# Patient Record
Sex: Female | Born: 1995 | Race: White | Hispanic: No | Marital: Single | Smoking: Never smoker
Health system: Southern US, Community
[De-identification: ages and names within clinical notes are randomized; demographics above are authoritative.]

## PROBLEM LIST (undated history)

## (undated) DIAGNOSIS — J45909 Unspecified asthma, uncomplicated: Secondary | ICD-10-CM

## (undated) DIAGNOSIS — G43909 Migraine, unspecified, not intractable, without status migrainosus: Secondary | ICD-10-CM

## (undated) DIAGNOSIS — T7840XA Allergy, unspecified, initial encounter: Secondary | ICD-10-CM

## (undated) HISTORY — DX: Migraine, unspecified, not intractable, without status migrainosus: G43.909

## (undated) HISTORY — PX: HAND SURGERY: SHX662

---
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
## 2017-08-16 ENCOUNTER — Emergency Department (HOSPITAL_BASED_OUTPATIENT_CLINIC_OR_DEPARTMENT_OTHER)
Admission: EM | Admit: 2017-08-16 | Discharge: 2017-08-16 | Disposition: A | Discharge status: Home / Self Care | Payer: BC Managed Care – PPO | Attending: Emergency Medicine | Admitting: Emergency Medicine

## 2017-08-16 ENCOUNTER — Encounter (HOSPITAL_BASED_OUTPATIENT_CLINIC_OR_DEPARTMENT_OTHER): Payer: Self-pay

## 2017-08-16 ENCOUNTER — Emergency Department (HOSPITAL_BASED_OUTPATIENT_CLINIC_OR_DEPARTMENT_OTHER): Payer: BC Managed Care – PPO

## 2017-08-16 ENCOUNTER — Other Ambulatory Visit: Payer: Self-pay

## 2017-08-16 DIAGNOSIS — J4521 Mild intermittent asthma with (acute) exacerbation: Secondary | ICD-10-CM | POA: Diagnosis not present

## 2017-08-16 DIAGNOSIS — R0602 Shortness of breath: Secondary | ICD-10-CM | POA: Diagnosis present

## 2017-08-16 DIAGNOSIS — R05 Cough: Secondary | ICD-10-CM | POA: Diagnosis not present

## 2017-08-16 DIAGNOSIS — B9789 Other viral agents as the cause of diseases classified elsewhere: Secondary | ICD-10-CM

## 2017-08-16 DIAGNOSIS — J069 Acute upper respiratory infection, unspecified: Secondary | ICD-10-CM | POA: Diagnosis not present

## 2017-08-16 DIAGNOSIS — Z79899 Other long term (current) drug therapy: Secondary | ICD-10-CM | POA: Diagnosis not present

## 2017-08-16 LAB — CBC WITH DIFFERENTIAL/PLATELET
BASOS ABS: 0.1 10*3/uL (ref 0.0–0.1)
Basophils Relative: 1 %
Eosinophils Absolute: 1.8 10*3/uL — ABNORMAL HIGH (ref 0.0–0.7)
Eosinophils Relative: 18 %
HEMATOCRIT: 42.3 % (ref 36.0–46.0)
Hemoglobin: 14.3 g/dL (ref 12.0–15.0)
LYMPHS PCT: 17 %
Lymphs Abs: 1.8 10*3/uL (ref 0.7–4.0)
MCH: 30.6 pg (ref 26.0–34.0)
MCHC: 33.8 g/dL (ref 30.0–36.0)
MCV: 90.4 fL (ref 78.0–100.0)
MONO ABS: 1 10*3/uL (ref 0.1–1.0)
Monocytes Relative: 10 %
NEUTROS ABS: 5.6 10*3/uL (ref 1.7–7.7)
Neutrophils Relative %: 54 %
Platelets: 298 10*3/uL (ref 150–400)
RBC: 4.68 MIL/uL (ref 3.87–5.11)
RDW: 12.8 % (ref 11.5–15.5)
WBC: 10.1 10*3/uL (ref 4.0–10.5)

## 2017-08-16 LAB — TROPONIN I: Troponin I: <0.03 ng/mL (ref <0.03)

## 2017-08-16 LAB — COMPREHENSIVE METABOLIC PANEL
ALT: 13 U/L — AB (ref 14–54)
AST: 21 U/L (ref 15–41)
Albumin: 4.3 g/dL (ref 3.5–5.0)
Alkaline Phosphatase: 75 U/L (ref 38–126)
Anion gap: 8 (ref 5–15)
BILIRUBIN TOTAL: 0.8 mg/dL (ref 0.3–1.2)
BUN: 11 mg/dL (ref 6–20)
CALCIUM: 9.3 mg/dL (ref 8.9–10.3)
CO2: 24 mmol/L (ref 22–32)
CREATININE: 0.59 mg/dL (ref 0.44–1.00)
Chloride: 106 mmol/L (ref 101–111)
GFR calc Af Amer: >60 mL/min (ref >60)
Glucose, Bld: 88 mg/dL (ref 65–99)
POTASSIUM: 4 mmol/L (ref 3.5–5.1)
Sodium: 138 mmol/L (ref 135–145)
TOTAL PROTEIN: 7.6 g/dL (ref 6.5–8.1)

## 2017-08-16 MED ORDER — AEROCHAMBER PLUS FLO-VU MEDIUM MISC
1.0000 | Freq: Once | Status: AC
  Administered 2017-08-16: 20:00:00
  Filled 2017-08-16: qty 1

## 2017-08-16 MED ORDER — DEXAMETHASONE 4 MG PO TABS
10.0000 mg | ORAL_TABLET | Freq: Once | ORAL | Status: AC
  Administered 2017-08-16: 10 mg via ORAL
  Filled 2017-08-16: qty 1

## 2017-08-16 MED ORDER — IPRATROPIUM-ALBUTEROL 0.5-2.5 (3) MG/3ML IN SOLN
3.0000 mL | RESPIRATORY_TRACT | Status: DC | PRN
  Administered 2017-08-16: 3 mL via RESPIRATORY_TRACT
  Filled 2017-08-16: qty 3

## 2017-08-16 MED ORDER — ALBUTEROL SULFATE (2.5 MG/3ML) 0.083% IN NEBU
5.0000 mg | INHALATION_SOLUTION | Freq: Once | RESPIRATORY_TRACT | Status: AC
  Administered 2017-08-16: 5 mg via RESPIRATORY_TRACT
  Filled 2017-08-16: qty 6

## 2017-08-16 MED ORDER — ALBUTEROL (5 MG/ML) CONTINUOUS INHALATION SOLN: 20.0000 mg/h | INHALATION_SOLUTION | RESPIRATORY_TRACT | Status: DC

## 2017-08-16 MED ORDER — IPRATROPIUM-ALBUTEROL 0.5-2.5 (3) MG/3ML IN SOLN
3.0000 mL | Freq: Once | RESPIRATORY_TRACT | Status: AC
  Administered 2017-08-16: 3 mL via RESPIRATORY_TRACT
  Filled 2017-08-16: qty 3

## 2017-08-16 MED ORDER — ALBUTEROL SULFATE HFA 108 (90 BASE) MCG/ACT IN AERS
2.0000 | INHALATION_SPRAY | RESPIRATORY_TRACT | Status: DC | PRN
  Administered 2017-08-16: 2 via RESPIRATORY_TRACT
  Filled 2017-08-16: qty 6.7

## 2017-08-16 NOTE — ED Notes (Signed)
Pt was unable to provide a urine specimen at this time. Pt was ambulatory to XR and now is mildly labored.

## 2017-08-16 NOTE — ED Provider Notes (Signed)
[facility] EMERGENCY DEPARTMENT Provider Note   CSN: [id number] Arrival date & time: 08/16/17  1449     History   Chief Complaint Chief Complaint  Patient presents with  . Shortness of Breath    HPI Vanessa Contreras is a 21 y.o. female.  21yo F w/ h/o exercise-induced asthma who p/w shortness of breath and cough.  2 weeks ago the patient began having cold-like symptoms including cough and congestion.  She seemed to get better for a few days and then her symptoms recurred.  She has continued to have cough and congestion and yesterday began feeling short of breath.  She states that her shortness of breath is worse when she is laying flat and she feels like she cannot take a deep breath in because she has chest pain with deep inspiration.  She also reports some sharp chest pain.  Her family has been sick with cold-like symptoms.  She denies any associated fevers, vomiting, diarrhea, or leg swelling/pain.  She has tried albuterol a few times for her symptoms, last dose was this morning, with no relief.  She went to her PCP office today and was sent here due to the nature of her complaints and tachypnea on exam.  No recent travel, history of blood clots, OCP use, or history of cancer. She does report stress at school recently.    The history is provided by the patient.  Shortness of Breath     Past Medical History:  Diagnosis Date  . Allergy   . Asthma    exercise induced    Patient Active Problem List   Diagnosis Date Noted  . Numbness on left side 05/11/2016  . Arm pain, left 05/11/2016  . Hemiplegic migraine without status migrainosus, not intractable 04/05/2016  . Epidermal cyst of neck 03/25/2013    Past Surgical History:  Procedure Laterality Date  . HAND SURGERY     giant cell tumor removed lt hand    OB History    No data available       Home Medications    Prior to Admission medications   Medication Sig Start Date End Date Taking? Authorizing  Provider  albuterol (PROVENTIL HFA;VENTOLIN HFA) 108 (90 BASE) MCG/ACT inhaler Inhale 2 puffs into the lungs every 6 (six) hours as needed for wheezing. Reported on 04/05/2016    [provider]  cetirizine (ZYRTEC) 10 MG tablet Take 10 mg by mouth daily.    [provider]  doxycycline (VIBRAMYCIN) 100 MG capsule  03/07/16   [provider]    Family History Family History  Problem Relation Age of Onset  . Migraines Mother   . Depression Maternal Grandmother   . Anxiety disorder Maternal Grandmother     Social History Social History   Tobacco Use  . Smoking status: Never Smoker  . Smokeless tobacco: Never Used  Substance Use Topics  . Alcohol use: No  . Drug use: No     Allergies   Patient has no known allergies.   Review of Systems Review of Systems  Respiratory: Positive for shortness of breath.    All other systems reviewed and are negative except that which was mentioned in HPI   Physical Exam Updated Vital Signs BP (!) 100/53   Pulse (!) 138   Temp 97.8 F (36.6 C) (Oral)   Resp (!) 24   Ht 5\' 6"  (1.676 m)   Wt 54.4 kg (120 lb)   LMP 08/15/2017   SpO2 97%  BMI 19.37 kg/m   Physical Exam  Constitutional: She is oriented to person, place, and time. She appears well-developed and well-nourished. No distress.  HENT:  Head: Normocephalic and atraumatic.  Mouth/Throat: No oropharyngeal exudate.  Moist mucous membranes  Eyes: Conjunctivae are normal. Pupils are equal, round, and reactive to light.  Neck: Neck supple.  Cardiovascular: Normal rate, regular rhythm and normal heart sounds.  No murmur heard. Pulmonary/Chest: Effort normal. Tachypnea noted. No respiratory distress. She has decreased breath sounds in the right lower field and the left lower field. She has wheezes in the left upper field and the left lower field.  Mildly increased WOB  Abdominal: Soft. Bowel sounds are normal. She exhibits no distension. There is no  tenderness.  Musculoskeletal: She exhibits no edema.  Neurological: She is alert and oriented to person, place, and time.  Fluent speech  Skin: Skin is warm and dry.  Psychiatric: She has a normal mood and affect. Judgment normal.  Nursing note and vitals reviewed.    ED Treatments / Results  Labs (all labs ordered are listed, but only abnormal results are displayed) Labs Reviewed  CBC WITH DIFFERENTIAL/PLATELET - Abnormal; Notable for the following components:      Result Value   Eosinophils Absolute 1.8 (*)    All other components within normal limits  COMPREHENSIVE METABOLIC PANEL - Abnormal; Notable for the following components:   ALT 13 (*)    All other components within normal limits  TROPONIN I  PREGNANCY, URINE    EKG  EKG Interpretation  Date/Time:  Saturday August 16 2017 15:02:59 EST Ventricular Rate:  89 PR Interval:    QRS Duration: 90 QT Interval:  353 QTC Calculation: 430 R Axis:   106 Text Interpretation:  Sinus rhythm Borderline right axis deviation No significant change since last tracing Confirmed by Theotis Burrow [phone]) on 08/16/2017 3:28:38 PM       Radiology Dg Chest 2 View  Result Date: 08/16/2017 CLINICAL DATA:  Shortness of breath with chest pain EXAM: CHEST  2 VIEW COMPARISON:  October 05, 2012 FINDINGS: There is no edema or consolidation. Heart size and pulmonary vascularity are normal. No adenopathy. No pneumothorax. No bone lesions. IMPRESSION: No edema or consolidation. Electronically Signed   By: Lowella Grip III M.D.   On: 08/16/2017 16:04    Procedures .Critical Care Performed by: Sharlett Iles, MD Authorized by: Sharlett Iles, MD   Critical care provider statement:    Critical care time (minutes):  30   Critical care time was exclusive of:  Separately billable procedures and treating other patients   Critical care was necessary to treat or prevent imminent or life-threatening deterioration of the  following conditions:  Respiratory failure   Critical care was time spent personally by me on the following activities:  Development of treatment plan with patient or surrogate, evaluation of patient's response to treatment, examination of patient, obtaining history from patient or surrogate, ordering and performing treatments and interventions, ordering and review of radiographic studies, re-evaluation of patient's condition and ordering and review of laboratory studies   (including critical care time)  Medications Ordered in ED Medications  ipratropium-albuterol (DUONEB) 0.5-2.5 (3) MG/3ML nebulizer solution 3 mL (3 mLs Nebulization Given 08/16/17 1605)  albuterol (PROVENTIL HFA;VENTOLIN HFA) 108 (90 Base) MCG/ACT inhaler 2 puff (not administered)  AEROCHAMBER PLUS FLO-VU MEDIUM MISC 1 each (not administered)  ipratropium-albuterol (DUONEB) 0.5-2.5 (3) MG/3ML nebulizer solution 3 mL (3 mLs Nebulization Given 08/16/17 1525)  dexamethasone (DECADRON)  tablet 10 mg (10 mg Oral Given 08/16/17 1558)  albuterol (PROVENTIL) (2.5 MG/3ML) 0.083% nebulizer solution 5 mg (5 mg Nebulization Given 08/16/17 1702)  albuterol (PROVENTIL) (2.5 MG/3ML) 0.083% nebulizer solution 5 mg (5 mg Nebulization Given 08/16/17 1701)  albuterol (PROVENTIL) (2.5 MG/3ML) 0.083% nebulizer solution 5 mg (5 mg Nebulization Given 08/16/17 1701)  albuterol (PROVENTIL) (2.5 MG/3ML) 0.083% nebulizer solution 5 mg (5 mg Nebulization Given 08/16/17 1701)     Initial Impression / Assessment and Plan / ED Course  I have reviewed the triage vital signs and the nursing notes.  Pertinent labs & imaging results that were available during my care of the patient were reviewed by me and considered in my medical decision making (see chart for details).     URI sx, now developed SOB and chest pain. Cough has continued. Non-toxic on exam with reassuring VS. Wheezes in L lung, diminished b/l. Gave duoneb x 2 with only mild improvement,  however persistently diminished thus placed on 1hr continuous albuterol.  Observed for an hour after continuous.  On reexamination, she was comfortable stating that her chest pain had completely resolved and her shortness of breath was much better.  Mom states that she looked a lot better and patient was able to ambulate without problems, O2 saturation 100% after ambulation.  Her chest x-ray shows no infiltrate, labs including WBC count are unremarkable.  I considered PE but patient has no risk factors for PE and has demonstrated much improvement after albuterol.  Although she states she has only intermittent exercise-induced asthma, I suspect asthma exacerbation from viral URI.  She will follow-up with her PCP this week and understands supportive care instructions including scheduled albuterol and cough suppressant medications.  Return precautions extensively reviewed with patient and her mom.  They voiced understanding and she was discharged in satisfactory condition.  Final Clinical Impressions(s) / ED Diagnoses   Final diagnoses:  Mild intermittent asthma with exacerbation  Viral URI with cough    ED Discharge Orders    None       Rashee Marschall, Wenda Overland, MD 08/16/17 1946

## 2017-08-16 NOTE — ED Notes (Signed)
Pt noted to have some tremors in the hands. Pt unsure if she is cold or nervous. 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

## 2017-08-16 NOTE — ED Triage Notes (Signed)
SOB and palpitations x 2 weeks, worsening last night. Sent from [facility] for further eval.

## 2017-09-17 ENCOUNTER — Other Ambulatory Visit: Payer: BC Managed Care – PPO

## 2017-09-17 ENCOUNTER — Encounter: Payer: Self-pay | Admitting: Pulmonary Disease

## 2017-09-17 ENCOUNTER — Ambulatory Visit: Payer: BC Managed Care – PPO | Admitting: Pulmonary Disease

## 2017-09-17 VITALS — BP 120/70 | HR 71 | Ht 66.0 in | Wt 125.0 lb

## 2017-09-17 DIAGNOSIS — R05 Cough: Secondary | ICD-10-CM

## 2017-09-17 DIAGNOSIS — R059 Cough, unspecified: Secondary | ICD-10-CM

## 2017-09-17 LAB — NITRIC OXIDE: Nitric Oxide: 138

## 2017-09-17 MED ORDER — FLUTICASONE FUROATE-VILANTEROL 200-25 MCG/INH IN AEPB: 1.0000 | INHALATION_SPRAY | Freq: Every day | RESPIRATORY_TRACT | 6 refills | Status: DC

## 2017-09-17 MED ORDER — FLUTICASONE FUROATE-VILANTEROL 200-25 MCG/INH IN AEPB: 1.0000 | INHALATION_SPRAY | Freq: Every day | RESPIRATORY_TRACT | 0 refills | Status: AC

## 2017-09-17 NOTE — Progress Notes (Addendum)
Vanessa Contreras    [id number]    03-06-1996  Primary Care Physician:Webb, Arbie Cookey, MD  Referring Physician: Maurice Small, MD [address] [place], [place] [address]  Chief complaint: Consult for evaluation of asthma  HPI: 21 year old with history of allergies, asthma.  She has a history of childhood asthma with daily symptoms of cough, shortness of breath, wheezing.  Seen in the ED in October 2018 after a viral tract infection with acute worsening of dyspnea.  She was given nebulizers with improvement in symptoms. She is on albuterol inhaler which she uses several times a day.  Denies nocturnal awakenings. She has multiple environmental allergies and sees Dr. Heber [place], allergist at [facility] and her skin testing shows sensitivity to pollen, grass, cats, dogs and multiple other irritants.  She has started skin shots couple of weeks ago.  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 Exposures: Exposure to animals as noted above.  No mold exposure. Smoking history: Never smoker. No secondhand smoke. Travel History: Not significant  Outpatient Encounter Medications as of 09/17/2017  Medication Sig  . albuterol (PROVENTIL HFA;VENTOLIN HFA) 108 (90 BASE) MCG/ACT inhaler Inhale 2 puffs into the lungs every 6 (six) hours as needed for wheezing. Reported on 04/05/2016  . cetirizine (ZYRTEC) 10 MG tablet Take 10 mg by mouth daily.  Marland Kitchen doxycycline (VIBRAMYCIN) 100 MG capsule    No facility-administered encounter medications on file as of 09/17/2017.     Allergies as of 09/17/2017  . (No Known Allergies)    Past Medical History:  Diagnosis Date  . Allergy   . Asthma    exercise induced    Past Surgical History:  Procedure Laterality Date  . EAR CYST EXCISION Left 03/25/2013   Procedure: EXCISION OF A LEFT POSTERIOR AURICULAR CYST;   Surgeon: Theodoro Kos, DO;  Location: [facility];  Service: Plastics;  Laterality: Left;  . HAND SURGERY     giant cell tumor removed lt hand    Family History  Problem Relation Age of Onset  . Migraines Mother   . Depression Maternal Grandmother   . Anxiety disorder Maternal Grandmother     Social History   Socioeconomic History  . Marital status: Single    Spouse name: Not on file  . Number of children: Not on file  . Years of education: Not on file  . Highest education level: Not on file  Social Needs  . Financial resource strain: Not on file  . Food insecurity - worry: Not on file  . Food insecurity - inability: Not on file  . Transportation needs - medical: Not on file  . Transportation needs - non-medical: Not on file  Occupational History  . Not on file  Tobacco Use  . Smoking status: Never Smoker  . Smokeless tobacco: Never Used  Substance and Sexual Activity  . Alcohol use: No  . Drug use: No  . Sexual activity: No  Other Topics Concern  . 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 She does well in school. She lives with her parents and siblings. 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     Review of systems: Review of Systems  Constitutional: Negative for fever and chills.  HENT: Negative.   Eyes: Negative  for blurred vision.  Respiratory: as per HPI  Cardiovascular: Negative for chest pain and palpitations.  Gastrointestinal: Negative for vomiting, diarrhea, blood per rectum. Genitourinary: Negative for dysuria, urgency, frequency and hematuria.  Musculoskeletal: Negative for myalgias, back pain and joint pain.  Skin: Negative for itching and rash.  Neurological: Negative for dizziness, tremors, focal weakness, seizures and loss of consciousness.  Endo/Heme/Allergies: Negative for environmental allergies.  Psychiatric/Behavioral: Negative for depression, suicidal ideas and hallucinations.  All  other systems reviewed and are negative.  Physical Exam: Blood pressure 120/70, pulse 71, height 5\' 6"  (1.676 m), weight 125 lb (56.7 kg), SpO2 98 %. Gen:      No acute distress HEENT:  EOMI, sclera anicteric Neck:     No masses; no thyromegaly Lungs:    Clear to auscultation bilaterally; normal respiratory effort CV:         Regular rate and rhythm; no murmurs Abd:      + bowel sounds; soft, non-tender; no palpable masses, no distension Ext:    No edema; adequate peripheral perfusion Skin:      Warm and dry; no rash Neuro: alert and oriented x 3 Psych: normal mood and affect  Data Reviewed: FENO 09/17/17-138  CBC with diff 08/16/17- WBC 10.1, eosinophils 18%, absolute eosinophil count 1800.  CXR 08/16/17- No acute abnormality.  Assessment:  Evaluation for asthma Cloey has classic atopic allergic asthma and her symptoms and lung inflammation are not under good control.  Her acute worsening may have been set off by viral respiratory tract infection but as per mom she has been symptomatic even before this.  She has allergies to multiple animals but keeps a lot of rescue pets at home and wants to attend veterenary school.  She just uses the albuterol rescue inhaler but will need to be on a controller medication.  I will start her on Brio 200 Check blood allergy profile and obtain records from her allergist at [facility]. Schedule for pulmonary function test.  Plan/Recommendations: - Blood allergy profile, PFTs - Start Breo 200, continue albuterol  Marshell Garfinkel MD [facility] Pager [phone] 09/17/2017, 9:50 AM  CC: Maurice Small, MD  Addendum: Received note from [facility] [place].  Office visit 09/23/17 Skin testing performed showing significant reactivity to tree pollen, grass pollen, weed pollen, dust mite, dog, cat and molds.  Negative to cockroach.  Plan-begin Rhinocort, Pazeo eyedrops.  Continue Zyrtec Start  immunotherapy.  Marshell Garfinkel MD [facility] Pager [phone] 10/22/2017, 9:00 AM

## 2017-09-17 NOTE — Addendum Note (Signed)
Addended by: Maryanna Shape A on: 09/17/2017 10:38 AM   Modules accepted: Orders

## 2017-09-17 NOTE — Patient Instructions (Signed)
We will check a blood allergy profile today Schedule you for pulmonary function test Continue the albuterol inhaler.  You will need to use this as a rescue inhaler to be used as needed We will start you on a regular controller inhaler to reduce levels of inflammation in your lungs. The controller medication's name is breo.  You need to use it once a day.  We will give instructions on how to use it We will obtain notes from your allergy doctor for our record I will you her back in 1 month.

## 2017-09-18 LAB — RESPIRATORY ALLERGY PROFILE REGION II ~~LOC~~
ALLERGEN, A. ALTERNATA, M6: 8.91 kU/L — AB
ALLERGEN, COTTONWOOD, T14: 0.29 kU/L — AB
ALLERGEN, D PTERNOYSSINUS, D1: 0.54 kU/L — AB
ALLERGEN, MOUSE U PROTEIN, E72: 6.31 kU/L — AB
ALLERGEN, OAK, T7: 0.11 kU/L — AB
ASPERGILLUS FUMIGATUS M3: 1.13 kU/L — AB
Allergen, Cedar tree, t12: 0.22 kU/L — ABNORMAL HIGH
Allergen, Comm Silver Birch, t9: <0.1 kU/L
Allergen, Mulberry, t76: <0.1 kU/L
Allergen, P. notatum, m1: 1.42 kU/L — ABNORMAL HIGH
BERMUDA GRASS: 0.13 kU/L — AB
BOX ELDER: 0.25 kU/L — AB
CLADOSPORIUM HERBARUM (M2) IGE: 1.19 kU/L — ABNORMAL HIGH
CLASS: 0
CLASS: 0
CLASS: 0
CLASS: 0
CLASS: 0
CLASS: 0
CLASS: 1
CLASS: 2
CLASS: 2
COMMON RAGWEED (SHORT) (W1) IGE: 0.13 kU/L — ABNORMAL HIGH
Cat Dander: 1.09 kU/L — ABNORMAL HIGH
Class: 0
Class: 0
Class: 0
Class: 0
Class: 0
Class: 0
Class: 0
Class: 0
Class: 0
Class: 2
Class: 2
Class: 2
Class: 2
Class: 3
Class: 3
Cockroach: <0.1 kU/L
D. FARINAE: 0.75 kU/L — AB
Dog Dander: 1.76 kU/L — ABNORMAL HIGH
ELM IGE: 0.15 kU/L — AB
IgE (Immunoglobulin E), Serum: 1301 kU/L — ABNORMAL HIGH (ref <=114)
JOHNSON GRASS: 0.16 kU/L — AB
Pecan/Hickory Tree IgE: 0.25 kU/L — ABNORMAL HIGH
ROUGH PIGWEED IGE: 0.19 kU/L — AB
SHEEP SORREL IGE: 0.11 kU/L — AB
TIMOTHY GRASS: 0.26 kU/L — AB

## 2017-09-18 LAB — INTERPRETATION:

## 2017-10-20 ENCOUNTER — Telehealth: Payer: Self-pay | Admitting: Pulmonary Disease

## 2017-10-20 NOTE — Telephone Encounter (Signed)
Spoke with pt's dad and he states he just received a bill for the Feno. This is not covered by the insurance. Kathlee Nations can we take this off his bill? Thanks    [employer] states:  Request for nitric oxide is denied, per corporate medical policy this service is considered investigational. [facility] found insufficient peer review literature to show a beneficial effect on health outcomes compared to established alternatives. The member policy does not cover investigational services.

## 2017-10-20 NOTE — Telephone Encounter (Signed)
[place] noted. Thanks Kathlee Nations

## 2017-10-20 NOTE — Telephone Encounter (Signed)
The Feno was written off, he will not receive a bill from [facility] for this.  Thanks,  Kathlee Nations

## 2017-10-24 ENCOUNTER — Ambulatory Visit: Payer: BC Managed Care – PPO | Admitting: Pulmonary Disease

## 2017-11-07 ENCOUNTER — Ambulatory Visit: Payer: BC Managed Care – PPO | Admitting: Pulmonary Disease

## 2017-11-07 ENCOUNTER — Ambulatory Visit (INDEPENDENT_AMBULATORY_CARE_PROVIDER_SITE_OTHER): Payer: BC Managed Care – PPO | Admitting: Pulmonary Disease

## 2017-11-07 ENCOUNTER — Encounter: Payer: Self-pay | Admitting: Pulmonary Disease

## 2017-11-07 VITALS — BP 116/72 | HR 75 | Ht 66.0 in | Wt 125.0 lb

## 2017-11-07 DIAGNOSIS — R0602 Shortness of breath: Secondary | ICD-10-CM

## 2017-11-07 DIAGNOSIS — R05 Cough: Secondary | ICD-10-CM | POA: Diagnosis not present

## 2017-11-07 DIAGNOSIS — J4540 Moderate persistent asthma, uncomplicated: Secondary | ICD-10-CM

## 2017-11-07 DIAGNOSIS — R059 Cough, unspecified: Secondary | ICD-10-CM

## 2017-11-07 LAB — PULMONARY FUNCTION TEST
DL/VA % PRED: 108 %
DL/VA: 5.49 ml/min/mmHg/L
DLCO unc % pred: 100 %
DLCO unc: 27.19 ml/min/mmHg
FEF 25-75 POST: 3.66 L/s
FEF 25-75 Pre: 5.2 L/sec
FEF2575-%CHANGE-POST: -29 %
FEF2575-%PRED-PRE: 133 %
FEF2575-%Pred-Post: 94 %
FEV1-%Change-Post: -4 %
FEV1-%Pred-Post: 94 %
FEV1-%Pred-Pre: 99 %
FEV1-PRE: 3.49 L
FEV1-Post: 3.32 L
FEV1FVC-%CHANGE-POST: 0 %
FEV1FVC-%Pred-Pre: 105 %
FEV6-%CHANGE-POST: -5 %
FEV6-%Pred-Post: 89 %
FEV6-%Pred-Pre: 94 %
FEV6-PRE: 3.81 L
FEV6-Post: 3.6 L
FEV6FVC-%Pred-Post: 100 %
FEV6FVC-%Pred-Pre: 100 %
FVC-%CHANGE-POST: -5 %
FVC-%PRED-POST: 89 %
FVC-%Pred-Pre: 94 %
FVC-Post: 3.6 L
FVC-Pre: 3.81 L
POST FEV1/FVC RATIO: 92 %
Post FEV6/FVC ratio: 100 %
Pre FEV1/FVC ratio: 92 %
Pre FEV6/FVC Ratio: 100 %
RV % pred: 125 %
RV: 1.59 L
TLC % pred: 93 %
TLC: 5.03 L

## 2017-11-07 LAB — NITRIC OXIDE: Nitric Oxide: 24

## 2017-11-07 NOTE — Patient Instructions (Addendum)
I am glad that your breathing is improved on the inhaler Continue Breo for now.  Will recheck FENO Your lung function test look normal which is good news. Follow-up in 3 months.

## 2017-11-07 NOTE — Progress Notes (Signed)
PFT completed today 11/07/17

## 2017-11-07 NOTE — Progress Notes (Signed)
Vanessa Contreras    [id number]    1995/11/15  Primary Care Physician:Webb, Arbie Cookey, MD  Referring Physician: Maurice Small, MD [address] [place], [place] [address]  Chief complaint: Follow-up for moderate persistent asthma  HPI: 22 year old with history of allergies, asthma.  She has a history of childhood asthma with daily symptoms of cough, shortness of breath, wheezing.  Seen in the ED in October 2018 after a viral tract infection with acute worsening of dyspnea.  She was given nebulizers with improvement in symptoms. She is on albuterol inhaler which she uses several times a day.  Denies nocturnal awakenings. She has multiple environmental allergies and sees Dr. Heber [place], allergist at [facility] and her skin testing shows sensitivity to pollen, grass, cats, dogs and multiple other irritants.  She has started skin shots couple of weeks ago.  Received note from [facility] of [place].  Office visit 05/11/15 Skin testing performed showing significant reactivity to tree pollen, grass pollen, weed pollen, dust mite, dog, cat and molds.  Negative to cockroach.  Plan-begin Rhinocort, Pazeo eyedrops.  Continue Zyrtec Start immunotherapy.  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 Exposures: Exposure to animals as noted above.  No mold exposure. Smoking history: Never smoker. No secondhand smoke. Travel History: Not significant  Interim history: Started on breo at last visit.  She reports significant improvement in her symptoms of dyspnea.  She is not using her rescue inhaler anymore and continues on allergy shots.  Outpatient Encounter Medications as of 11/07/2017  Medication Sig  . albuterol (PROVENTIL HFA;VENTOLIN HFA) 108 (90 BASE) MCG/ACT inhaler Inhale 2 puffs into the lungs every 6 (six) hours as needed for wheezing. Reported on  04/05/2016  . cetirizine (ZYRTEC) 10 MG tablet Take 10 mg by mouth daily.  Marland Kitchen doxycycline (VIBRAMYCIN) 100 MG capsule   . fluticasone furoate-vilanterol (BREO ELLIPTA) 200-25 MCG/INH AEPB Inhale 1 puff into the lungs daily.   No facility-administered encounter medications on file as of 11/07/2017.     Allergies as of 11/07/2017  . (No Known Allergies)    Past Medical History:  Diagnosis Date  . Allergy   . Asthma    exercise induced    Past Surgical History:  Procedure Laterality Date  . EAR CYST EXCISION Left 03/25/2013   Procedure: EXCISION OF A LEFT POSTERIOR AURICULAR CYST;  Surgeon: Theodoro Kos, DO;  Location: [facility];  Service: Plastics;  Laterality: Left;  . HAND SURGERY     giant cell tumor removed lt hand    Family History  Problem Relation Age of Onset  . Migraines Mother   . Depression Maternal Grandmother   . Anxiety disorder Maternal Grandmother     Social History   Socioeconomic History  . Marital status: Single    Spouse name: Not on file  . Number of children: Not on file  . Years of education: Not on file  . Highest education level: Not on file  Social Needs  . Financial resource strain: Not on file  . Food insecurity - worry: Not on file  . Food insecurity - inability: Not on file  . Transportation needs - medical: Not on file  . Transportation needs - non-medical: Not on file  Occupational History  . Not on file  Tobacco Use  . Smoking status: Never Smoker  .  Smokeless tobacco: Never Used  Substance and Sexual Activity  . Alcohol use: No  . Drug use: No  . Sexual activity: No  Other Topics Concern  . 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 She lives with her parents and siblings. 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     Review of systems: Review of Systems  Constitutional: Negative for fever and chills.  HENT: Negative.   Eyes: Negative for  blurred vision.  Respiratory: as per HPI  Cardiovascular: Negative for chest pain and palpitations.  Gastrointestinal: Negative for vomiting, diarrhea, blood per rectum. Genitourinary: Negative for dysuria, urgency, frequency and hematuria.  Musculoskeletal: Negative for myalgias, back pain and joint pain.  Skin: Negative for itching and rash.  Neurological: Negative for dizziness, tremors, focal weakness, seizures and loss of consciousness.  Endo/Heme/Allergies: Negative for environmental allergies.  Psychiatric/Behavioral: Negative for depression, suicidal ideas and hallucinations.  All other systems reviewed and are negative.  Physical Exam: Blood pressure 116/72, pulse 75, height 5\' 6"  (1.676 m), weight 125 lb (56.7 kg), SpO2 97 %. Gen:      No acute distress HEENT:  EOMI, sclera anicteric Neck:     No masses; no thyromegaly Lungs:    Clear to auscultation bilaterally; normal respiratory effort CV:         Regular rate and rhythm; no murmurs Abd:      + bowel sounds; soft, non-tender; no palpable masses, no distension Ext:    No edema; adequate peripheral perfusion Skin:      Warm and dry; no rash Neuro: alert and oriented x 3 Psych: normal mood and affect  Data Reviewed: FENO 09/17/17-138 FENO 11/07/16-24  CBC with diff 08/16/17- WBC 10.1, eosinophils 18%, absolute eosinophil count 1800. CXR 08/16/17- No acute abnormality.  Blood allergy profile 09/17/17 IgE 1301, RAST panel shows sensitivity to multiple allergens including cat, dog, dust mite, grass, pollen  PFTs 11/07/17 FVC 3.6 [9%], FEV1 3.32 [94%], F/F 92, TLC 93%, DLCO 100% Normal test  Assessment:  Moderate persistent asthma Vanessa Contreras has classic atopic allergic asthma. She has allergies to multiple animals but keeps a lot of rescue pets at home and wants to attend veterenary school. I cautioned her that she may need to be on some form of long-term controller medication if she continues to have environmental exposure to  animals. She is considering a switch in careers to biochemistry  Symptoms have improved on Breo inhaler with reduction and FENO She will continue on this for now and albuterol rescue inhaler. Reassess at next visit.  We may be able to start titrating the inhaler therapy  Plan/Recommendations: - Continue Breo 200, albuterol   Follow up in 3 months  Marshell Garfinkel MD [facility] Pager [phone] 11/07/2017, 4:48 PM  CC: Maurice Small, MD  Addendum:   Marshell Garfinkel MD [facility] Pager [phone] 11/07/2017, 4:48 PM

## 2018-02-16 ENCOUNTER — Ambulatory Visit (INDEPENDENT_AMBULATORY_CARE_PROVIDER_SITE_OTHER): Payer: BC Managed Care – PPO

## 2018-02-16 ENCOUNTER — Ambulatory Visit (INDEPENDENT_AMBULATORY_CARE_PROVIDER_SITE_OTHER): Payer: BC Managed Care – PPO | Admitting: Podiatry

## 2018-02-16 ENCOUNTER — Encounter: Payer: Self-pay | Admitting: Podiatry

## 2018-02-16 DIAGNOSIS — M779 Enthesopathy, unspecified: Secondary | ICD-10-CM | POA: Diagnosis not present

## 2018-02-16 DIAGNOSIS — M2012 Hallux valgus (acquired), left foot: Secondary | ICD-10-CM

## 2018-02-16 DIAGNOSIS — M25571 Pain in right ankle and joints of right foot: Secondary | ICD-10-CM

## 2018-02-16 DIAGNOSIS — M7750 Other enthesopathy of unspecified foot: Secondary | ICD-10-CM

## 2018-02-16 DIAGNOSIS — G8929 Other chronic pain: Secondary | ICD-10-CM

## 2018-02-16 NOTE — Progress Notes (Signed)
Subjective:    Patient ID: Vanessa Contreras, female    DOB: April 25, 1996, 22 y.o.   MRN: [id number]  HPI 22 year old female presenting for acute concerns.  Her primary concern is she is starting to notice some pain to her left big toe with shoes and pressure.  She said no recent treatment for this.  She also states that for several years her right ankle has been popping and will get stiff.  She is previously had x-rays which were negative she had no treatment for this.  She denies any recent injury or trauma to her feet in general.  No swelling.  No other concerns today.   Review of Systems  All other systems reviewed and are negative.  Past Medical History:  Diagnosis Date  . Allergy   . Asthma    exercise induced    Past Surgical History:  Procedure Laterality Date  . EAR CYST EXCISION Left 03/25/2013   Procedure: EXCISION OF A LEFT POSTERIOR AURICULAR CYST;  Surgeon: Theodoro Kos, DO;  Location: [place];  Service: Plastics;  Laterality: Left;  . HAND SURGERY     giant cell tumor removed lt hand     Current Outpatient Medications:  .  albuterol (PROVENTIL HFA;VENTOLIN HFA) 108 (90 BASE) MCG/ACT inhaler, Inhale 2 puffs into the lungs every 6 (six) hours as needed for wheezing. Reported on 04/05/2016, Disp: , Rfl:  .  cetirizine (ZYRTEC) 10 MG tablet, Take 10 mg by mouth daily., Disp: , Rfl:  .  doxycycline (VIBRAMYCIN) 100 MG capsule, , Disp: , Rfl:  .  fluticasone furoate-vilanterol (BREO ELLIPTA) 200-25 MCG/INH AEPB, Inhale 1 puff into the lungs daily., Disp: 60 each, Rfl: 6  No Known Allergies  Social History   Socioeconomic History  . Marital status: Single    Spouse name: Not on file  . Number of children: Not on file  . Years of education: Not on file  . Highest education level: Not on file  Occupational History  . Not on file  Social Needs  . Financial resource strain: Not on file  . Food insecurity:    Worry: Not on file    Inability: Not on file   . Transportation needs:    Medical: Not on file    Non-medical: Not on file  Tobacco Use  . Smoking status: Never Smoker  . Smokeless tobacco: Never Used  Substance and Sexual Activity  . Alcohol use: No  . Drug use: No  . Sexual activity: Never  Lifestyle  . Physical activity:    Days per week: Not on file    Minutes per session: Not on file  . Stress: Not on file  Relationships  . Social connections:    Talks on phone: Not on file    Gets together: Not on file    Attends religious service: Not on file    Active member of club or organization: Not on file    Attends meetings of clubs or organizations: Not on file    Relationship status: Not on file  . Intimate partner violence:    Fear of current or ex partner: Not on file    Emotionally abused: Not on file    Physically abused: Not on file    Forced sexual activity: Not on file  Other Topics Concern  . 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 She lives with her  parents and siblings. 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         Objective:   Physical Exam  General: AAO x3, NAD  Dermatological: Skin is warm, dry and supple bilateral. Nails x 10 are well manicured; remaining integument appears unremarkable at this time. There are no open sores, no preulcerative lesions, no rash or signs of infection present.  Vascular: Dorsalis Pedis artery and Posterior Tibial artery pedal pulses are 2/4 bilateral with immedate capillary fill time. Pedal hair growth present. No varicosities and no lower extremity edema present bilateral. There is no pain with calf compression, swelling, warmth, erythema.   Neruologic: Grossly intact via light touch bilateral. Vibratory intact via tuning fork bilateral. Protective threshold with Semmes Wienstein monofilament intact to all pedal sites bilateral.   Musculoskeletal: Cavus foot type is present.  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  There is no pain or crepitation with MPJ range of motion there is no first ray hypomobility present.  On the right side there is no significant discomfort to the ankle joint there is no pain or restriction with ankle joint range of motion there is no crepitation.  No other areas of tenderness identified at this time.  Muscular strength 5/5 in all groups tested bilateral.  Gait: Unassisted, Nonantalgic.     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  There is no evidence of acute fracture or stress fracture bilaterally.  Ankle joint space maintained on the right side. At this time we discussed both conservative as well as surgical treatment options.  She does have a history of wearing orthotics.  Given her cavus foot type this may be beneficial for her.  I think that some of the ankle pain that she is getting is due to biomechanical changes.  Also the hopefully the orthotics will help slow the progression of the bunion take pressure off the bunion as well.  Ultimately she will likely need to have surgical intervention if symptoms continue but we also discussed the change in shoe type as well as offloading of various pads.  She did not proceed with orthotics today.  Rick evaluated her and ordered her for the inserts.  Trula Slade DPM

## 2018-02-17 ENCOUNTER — Encounter: Payer: Self-pay | Admitting: Pulmonary Disease

## 2018-02-17 ENCOUNTER — Ambulatory Visit: Payer: BC Managed Care – PPO | Admitting: Pulmonary Disease

## 2018-02-17 VITALS — BP 90/60 | HR 65 | Ht 66.0 in | Wt 123.4 lb

## 2018-02-17 DIAGNOSIS — J4540 Moderate persistent asthma, uncomplicated: Secondary | ICD-10-CM | POA: Diagnosis not present

## 2018-02-17 MED ORDER — FLUTICASONE FUROATE-VILANTEROL 100-25 MCG/INH IN AEPB: 1.0000 | INHALATION_SPRAY | Freq: Every day | RESPIRATORY_TRACT | 12 refills | Status: DC

## 2018-02-17 NOTE — Progress Notes (Addendum)
Vanessa Contreras    [id number]    06/12/1996  Primary Care Physician:Webb, Arbie Cookey, MD  Referring Physician: Maurice Small, MD [address] [place], [place] [address]  Chief complaint: Follow-up for moderate persistent asthma  HPI: 22 year old with history of allergies, asthma.  She has a history of childhood asthma with daily symptoms of cough, shortness of breath, wheezing.  Seen in the ED in October 2018 after a viral tract infection with acute worsening of dyspnea.  She was given nebulizers with improvement in symptoms. She is on albuterol inhaler which she uses several times a day.  Denies nocturnal awakenings. She has multiple environmental allergies and sees Dr. Heber [place], allergist at [facility] and her skin testing shows sensitivity to pollen, grass, cats, dogs and multiple other irritants.  She has started skin shots couple of weeks ago.  Received note from [facility] [place].  Office visit 05/11/15 Skin testing performed showing significant reactivity to tree pollen, grass pollen, weed pollen, dust mite, dog, cat and molds.  Negative to cockroach.  Plan-begin Rhinocort, Pazeo eyedrops.  Continue Zyrtec Start immunotherapy.  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 Exposures: Exposure to animals as noted above.  No mold exposure. Smoking history: Never smoker. No secondhand smoke. Travel History: Not significant  Interim history: Continues on the Breo with good response.  She had a cold the past month but this has not affected her breathing She is not using her rescue inhaler due to control of symptoms She continues on the allergy shots.  Outpatient Encounter Medications as of 02/17/2018  Medication Sig  . albuterol (PROVENTIL HFA;VENTOLIN HFA) 108 (90 BASE) MCG/ACT inhaler Inhale 2 puffs into the lungs every 6 (six)  hours as needed for wheezing. Reported on 04/05/2016  . cetirizine (ZYRTEC) 10 MG tablet Take 10 mg by mouth daily.  . fluticasone furoate-vilanterol (BREO ELLIPTA) 200-25 MCG/INH AEPB Inhale 1 puff into the lungs daily.  . [DISCONTINUED] doxycycline (VIBRAMYCIN) 100 MG capsule    No facility-administered encounter medications on file as of 02/17/2018.     Allergies as of 02/17/2018  . (No Known Allergies)    Past Medical History:  Diagnosis Date  . Allergy   . Asthma    exercise induced    Past Surgical History:  Procedure Laterality Date  . EAR CYST EXCISION Left 03/25/2013   Procedure: EXCISION OF A LEFT POSTERIOR AURICULAR CYST;  Surgeon: Theodoro Kos, DO;  Location: [facility];  Service: Plastics;  Laterality: Left;  . HAND SURGERY     giant cell tumor removed lt hand    Family History  Problem Relation Age of Onset  . Migraines Mother   . Depression Maternal Grandmother   . Anxiety disorder Maternal Grandmother     Social History   Socioeconomic History  . Marital status: Single    Spouse name: Not on file  . Number of children: Not on file  . Years of education: Not on file  . Highest education level: Not on file  Occupational History  . Not on file  Social Needs  . Financial resource strain: Not on file  . Food insecurity:    Worry: Not on file    Inability: Not on file  . Transportation needs:    Medical: Not on file    Non-medical: Not  on file  Tobacco Use  . Smoking status: Never Smoker  . Smokeless tobacco: Never Used  Substance and Sexual Activity  . Alcohol use: No  . Drug use: No  . Sexual activity: Never  Lifestyle  . Physical activity:    Days per week: Not on file    Minutes per session: Not on file  . Stress: Not on file  Relationships  . Social connections:    Talks on phone: Not on file    Gets together: Not on file    Attends religious service: Not on file    Active member of club or organization: Not on file     Attends meetings of clubs or organizations: Not on file    Relationship status: Not on file  . Intimate partner violence:    Fear of current or ex partner: Not on file    Emotionally abused: Not on file    Physically abused: Not on file    Forced sexual activity: Not on file  Other Topics Concern  . 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 She lives with her parents and siblings. 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     Review of systems: Review of Systems  Constitutional: Negative for fever and chills.  HENT: Negative.   Eyes: Negative for blurred vision.  Respiratory: as per HPI  Cardiovascular: Negative for chest pain and palpitations.  Gastrointestinal: Negative for vomiting, diarrhea, blood per rectum. Genitourinary: Negative for dysuria, urgency, frequency and hematuria.  Musculoskeletal: Negative for myalgias, back pain and joint pain.  Skin: Negative for itching and rash.  Neurological: Negative for dizziness, tremors, focal weakness, seizures and loss of consciousness.  Endo/Heme/Allergies: Negative for environmental allergies.  Psychiatric/Behavioral: Negative for depression, suicidal ideas and hallucinations.  All other systems reviewed and are negative.  Physical Exam: Blood pressure 90/60, pulse 65, height 5\' 6"  (1.676 m), weight 123 lb 6.4 oz (56 kg), SpO2 100 %. Gen:      No acute distress HEENT:  EOMI, sclera anicteric Neck:     No masses; no thyromegaly Lungs:    Clear to auscultation bilaterally; normal respiratory effort CV:         Regular rate and rhythm; no murmurs Abd:      + bowel sounds; soft, non-tender; no palpable masses, no distension Ext:    No edema; adequate peripheral perfusion Skin:      Warm and dry; no rash Neuro: alert and oriented x 3 Psych: normal mood and affect  Data Reviewed: FENO 09/17/17-138 FENO 11/07/16-24  CBC with diff 08/16/17- WBC 10.1, eosinophils 18%,  absolute eosinophil count 1800. CXR 08/16/17- No acute abnormality.  Blood allergy profile 09/17/17 IgE 1301, RAST panel shows sensitivity to multiple allergens including cat, dog, dust mite, grass, pollen  PFTs 11/07/17 FVC 3.6 [9%], FEV1 3.32 [94%], F/F 92, TLC 93%, DLCO 100% Normal test  Assessment:  Moderate persistent asthma Aubriana has classic atopic allergic asthma. She has allergies to multiple animals but keeps a lot of rescue pets at home and wants to attend veterenary school. I cautioned her that she may need to be on some form of long-term controller medication if she continues to have environmental exposure to animals.   Symptoms have improved with Breo inhaler with reduction and FENO We will reduce the Breo to 100  Plan/Recommendations: - Reduce Breo to 100, continue albuterol as needed  Follow up in 6 months  Marshell Garfinkel MD [facility] 02/17/2018, 4:25 PM  CC: Maurice Small, MD   Addendum: Received note from [facility] [place] dated April 16, 2018 She continues on immunotherapy with overall good response Slight worsening of her breathing when Breo was reduced to 100 mcg.  Using albuterol 3 times a week.  Need to review symptom control return visit  Marshell Garfinkel MD [facility] 05/20/2018, 12:27 PM

## 2018-02-17 NOTE — Patient Instructions (Signed)
I am glad you are doing well with the breathing We will reduce the Breo to 100 Follow-up in November 2019.  Please call us sooner if there is any worsening of your symptoms with the change in medication.

## 2018-03-09 ENCOUNTER — Ambulatory Visit: Payer: BC Managed Care – PPO | Admitting: Orthotics

## 2018-03-09 DIAGNOSIS — M779 Enthesopathy, unspecified: Secondary | ICD-10-CM

## 2018-03-09 DIAGNOSIS — M2012 Hallux valgus (acquired), left foot: Secondary | ICD-10-CM

## 2018-03-09 DIAGNOSIS — G8929 Other chronic pain: Secondary | ICD-10-CM

## 2018-03-09 DIAGNOSIS — M25571 Pain in right ankle and joints of right foot: Secondary | ICD-10-CM

## 2018-03-09 NOTE — Progress Notes (Signed)
Patient came in today to pick up custom made foot orthotics.  The goals were accomplished and the patient reported no dissatisfaction with said orthotics.  Patient was advised of breakin period and how to report any issues. 

## 2018-04-13 ENCOUNTER — Telehealth: Payer: Self-pay | Admitting: Pulmonary Disease

## 2018-04-13 MED ORDER — ALBUTEROL SULFATE HFA 108 (90 BASE) MCG/ACT IN AERS: 2.0000 | INHALATION_SPRAY | Freq: Four times a day (QID) | RESPIRATORY_TRACT | 5 refills | Status: DC | PRN

## 2018-04-13 NOTE — Telephone Encounter (Signed)
rx of proventil inhaler sent to pt's preferred pharmacy.  Called pt and spoke with pt's mother Margreta Journey stating to her this was done.  Christina expressed understanding. Nothing further needed.

## 2018-04-16 ENCOUNTER — Encounter: Payer: Self-pay | Admitting: Pulmonary Disease

## 2018-04-16 LAB — PULMONARY FUNCTION TEST

## 2018-11-22 IMAGING — CR DG CHEST 2V
2 series · 2 of 2 positions shown · non-contrast
Comparison: October 05, 2012

CLINICAL DATA: Shortness of breath with chest pain

EXAM:
CHEST  2 VIEW

[w chest pa]
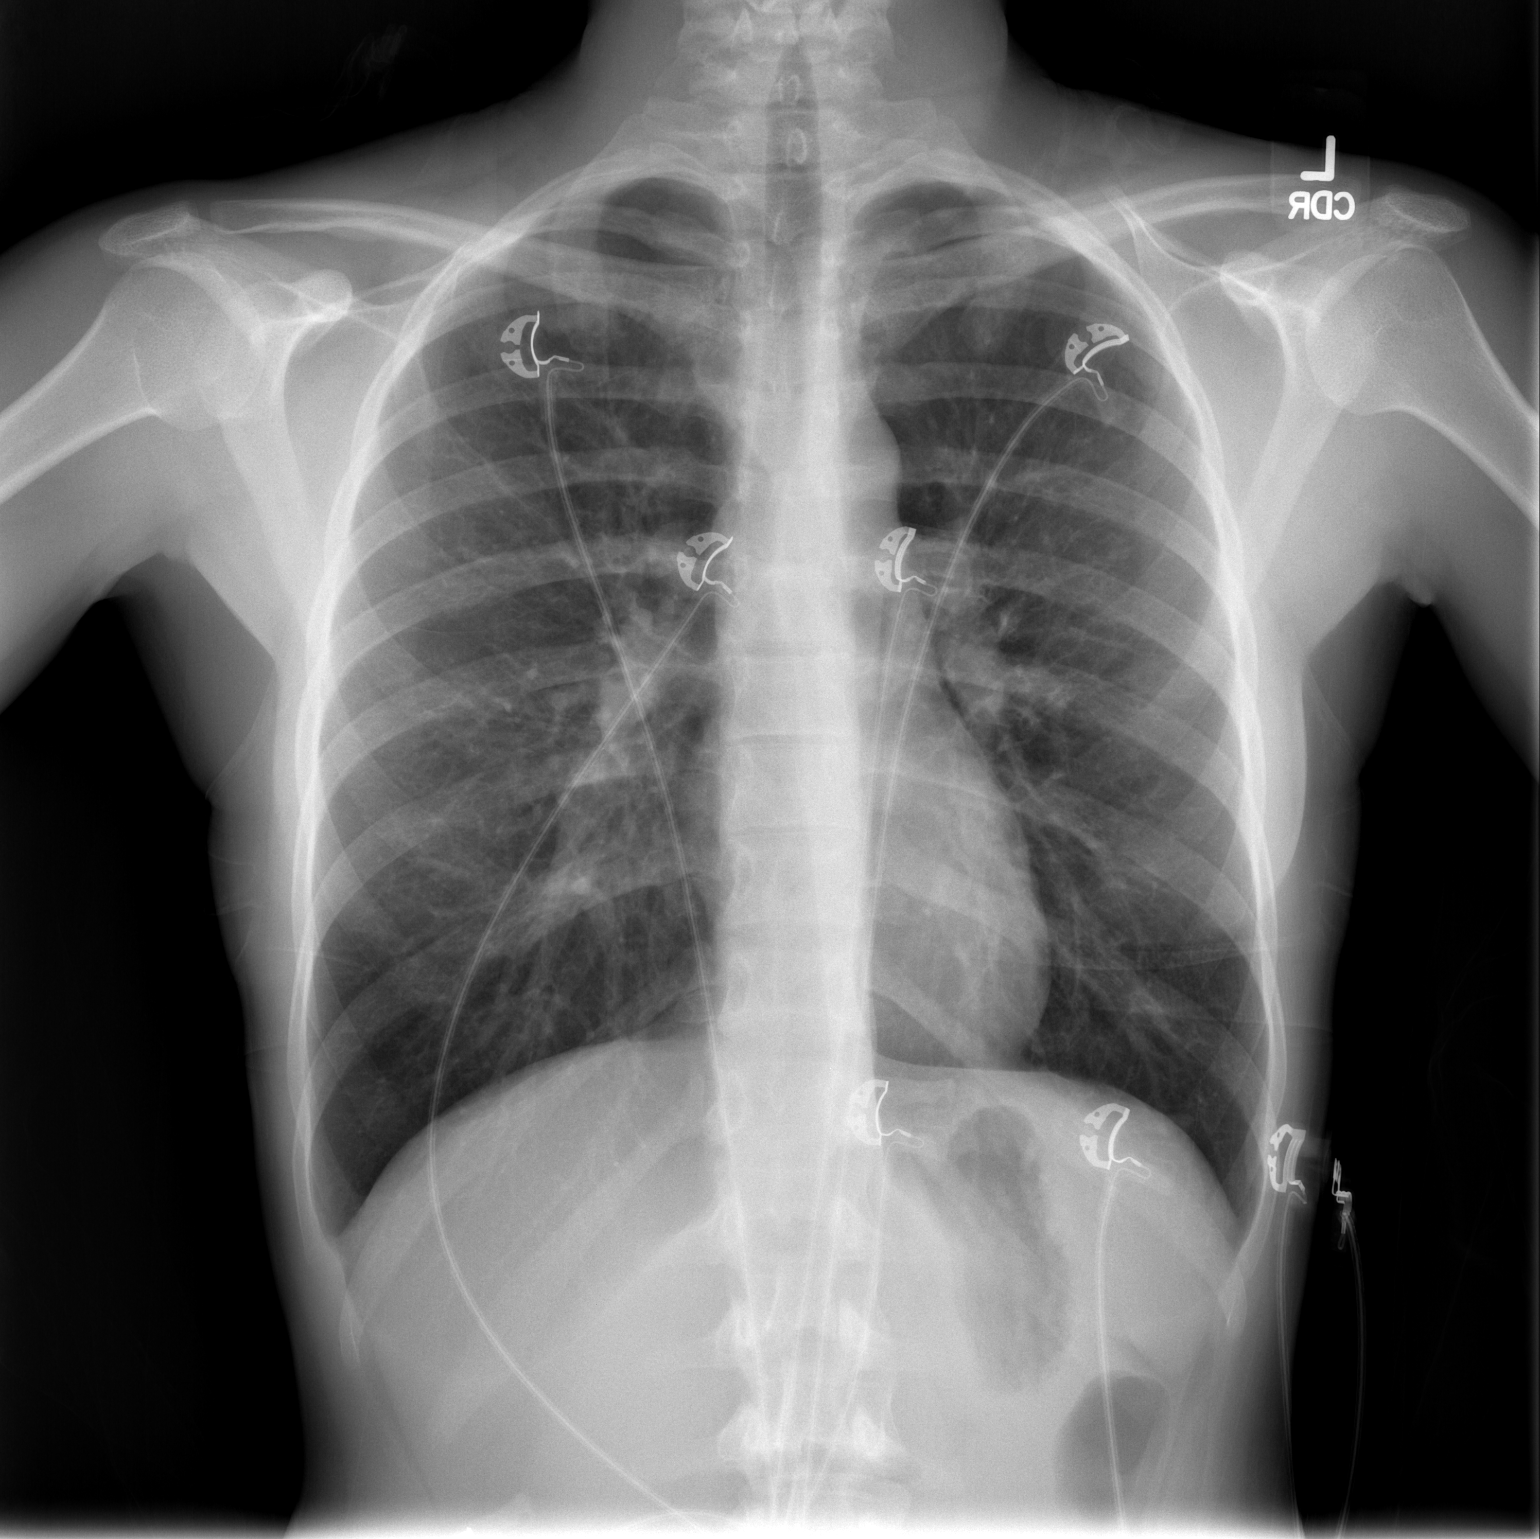

[w chest lat]
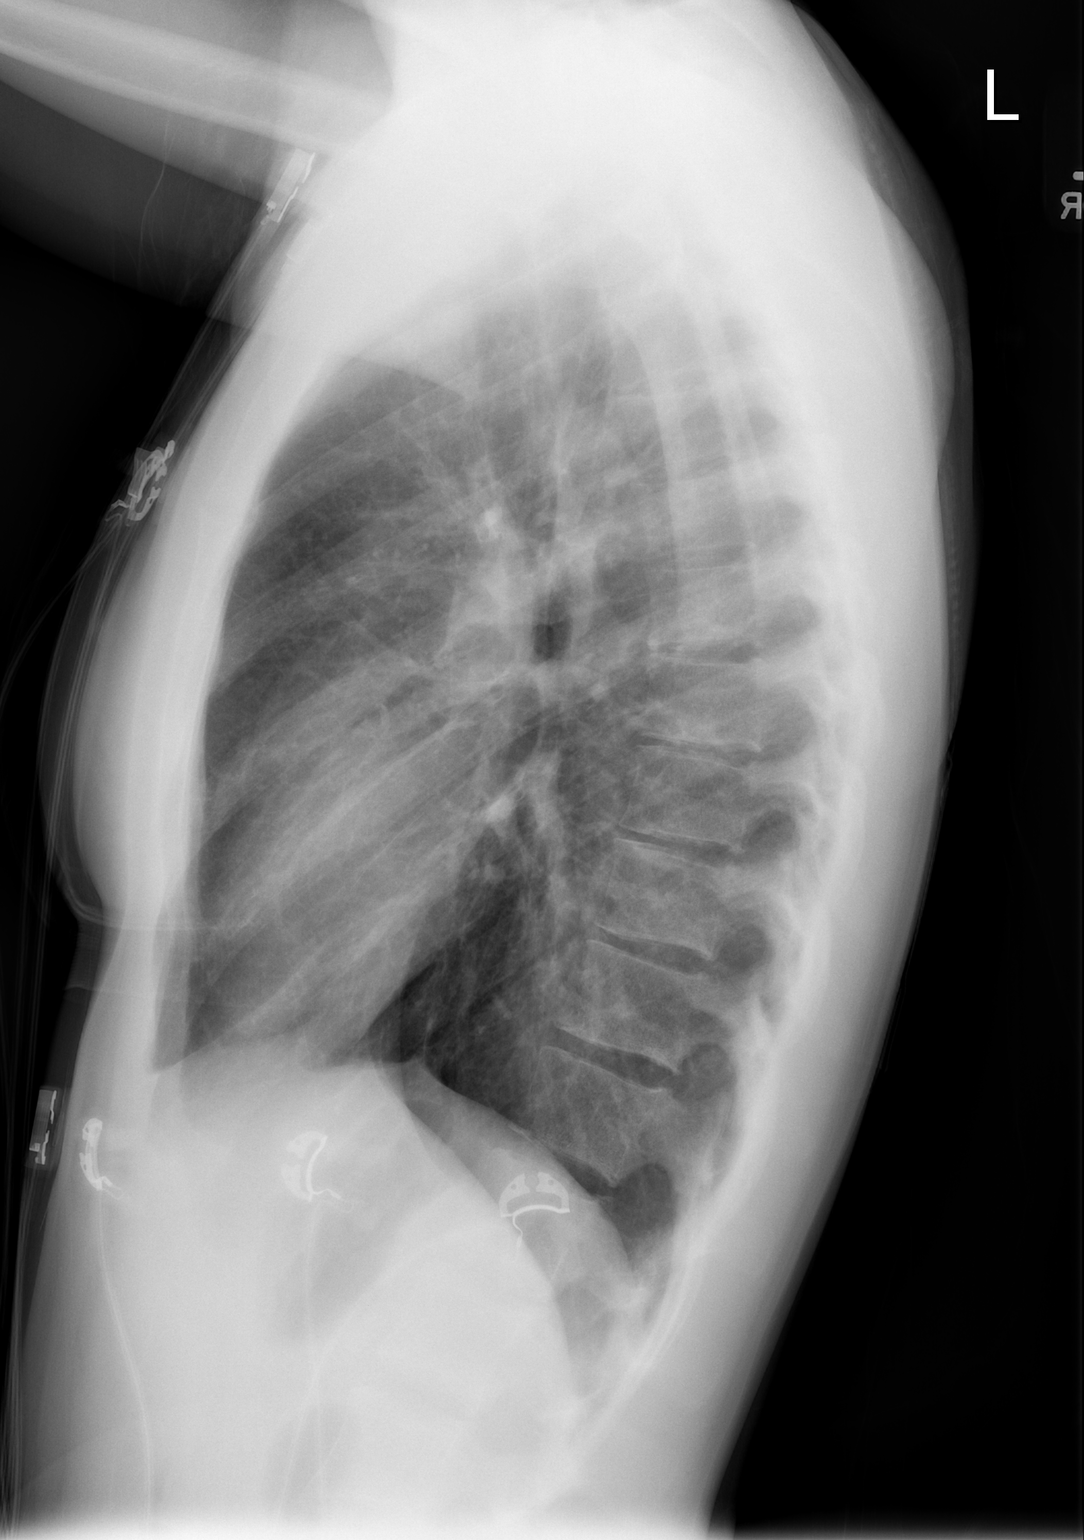

[2 of 2 positions shown; findings below may reference images not displayed]

FINDINGS: There is no edema or consolidation. Heart size and pulmonary
vascularity are normal. No adenopathy. No pneumothorax. No bone
lesions.
IMPRESSION: No edema or consolidation.

## 2020-10-09 ENCOUNTER — Other Ambulatory Visit: Payer: Self-pay

## 2020-10-09 ENCOUNTER — Ambulatory Visit: Payer: BC Managed Care – PPO | Admitting: Family Medicine

## 2020-10-09 ENCOUNTER — Encounter: Payer: Self-pay | Admitting: Family Medicine

## 2020-10-09 VITALS — BP 106/70 | HR 71 | Temp 98.5°F | Resp 12 | Ht 67.0 in | Wt 131.0 lb

## 2020-10-09 DIAGNOSIS — G43409 Hemiplegic migraine, not intractable, without status migrainosus: Secondary | ICD-10-CM | POA: Diagnosis not present

## 2020-10-09 DIAGNOSIS — Z7689 Persons encountering health services in other specified circumstances: Secondary | ICD-10-CM

## 2020-10-09 MED ORDER — RIZATRIPTAN BENZOATE 5 MG PO TABS: 5.0000 mg | ORAL_TABLET | ORAL | 0 refills | Status: DC | PRN

## 2020-10-09 MED ORDER — RIZATRIPTAN BENZOATE 5 MG PO TABS: 5.0000 mg | ORAL_TABLET | ORAL | 5 refills | Status: DC | PRN

## 2020-10-09 NOTE — Patient Instructions (Addendum)
Pleasure meeting you today.  Start b12 1000 mcg daily and magnesium 250-450 daily. This can help with migraine prevention.  Maxalt at onset of headache.   Physical in 8 weeks.    Please help [place] help you:  We are honored you have chosen [facility] for your Primary Care home. Below you will find basic instructions that you may need to access in the future. Please help [place] help you by reading the instructions, which cover many of the frequent questions we experience.   Prescription refills and request:  -In order to allow more efficient response time, please call your pharmacy for all refills. They will forward the request electronically to [place]. This allows for the quickest possible response. Request left on a nurse line can take longer to refill, since these are checked as time allows between office patients and other phone calls.  - refill request can take up to 3-5 working days to complete.  - If request is sent electronically and request is appropiate, it is usually completed in 1-2 business days.  - all patients will need to be seen routinely for all chronic medical conditions requiring prescription medications (see follow-up below). If you are overdue for follow up on your condition, you will be asked to make an appointment and we will call in enough medication to cover you until your appointment (up to 30 days).  - all controlled substances will require a face to face visit to request/refill.  - if you desire your prescriptions to go through a new pharmacy, and have an active script at original pharmacy, you will need to call your pharmacy and have scripts transferred to new pharmacy. This is completed between the pharmacy locations and not by your provider.    Results: Our office handles many outgoing and incoming calls daily. If we have not contacted you within 1 week about your results, please check your mychart to see if there is a message first and if not, then contact our office.   In helping with this matter, you help decrease call volume, and therefore allow [place] to be able to respond to patients needs more efficiently.  We will always attempt to call you with results,  normal or abnormal. However, if we are unable to reach you we will send a message in your my chart with results.   Acute office visits (sick visit):  An acute visit is intended for a new problem and are scheduled in shorter time slots to allow schedule openings for patients with new problems. This is the appropriate visit to discuss a new problem. Problems will not be addressed by phone call or Echart message. Appointment is needed if requesting treatment. In order to provide you with excellent quality medical care with proper time for you to explain your problem, have an exam and receive treatment with instructions, these appointments should be limited to one new problem per visit. If you experience a new problem, in which you desire to be addressed, please make an acute office visit, we save openings on the schedule to accommodate you. Please do not save your new problem for any other type of visit, let us take care of it properly and quickly for you.   Follow up visits:  Depending on your condition(s) your provider will need to see you routinely in order to provide you with quality care and prescribe medication(s). Most chronic conditions (Example: hypertension, Diabetes, depression/anxiety... etc), require visits a couple times a year. Your provider will instruct  you on proper follow up for your personal medical conditions and history. Please make certain to make follow up appointments for your condition as instructed. Failing to do so could result in lapse in your medication treatment/refills. If you request a refill, and are overdue to be seen on a condition, we will always provide you with a 30 day script (once) to allow you time to schedule.     Yearly physical (well visit):  - Adults are recommended to be  seen yearly for physicals. Check with your insurance and date of your last physical, most insurances require one calendar year between physicals. Physicals include all preventive health topics, screenings, medical exam and labs that are appropriate for gender/age and history. You may have fasting labs needed at this visit. This is a well visit (not a sick visit), new problems should not be covered during this visit (see acute visit).  - Pediatric patients are seen more frequently when they are younger. Your provider will advise you on well child visit timing that is appropriate for your their age. - This is not a medicare wellness visit. Medicare wellness exams do not have an exam portion to the visit. Some medicare companies allow for a physical, some do not allow a yearly physical. If your medicare allows a yearly physical you can schedule the medicare wellness with our nurse Maudie Mercury and have your physical with your provider after, on the same day. Please check with insurance for your full benefits.   Late Policy/No Shows:  - all new patients should arrive 15-30 minutes earlier than appointment to allow [place] time  to  obtain all personal demographics,  insurance information and for you to complete office paperwork. - All established patients should arrive 10-15 minutes earlier than appointment time to update all information and be checked in .  - In our best efforts to run on time, if you are late for your appointment you will be asked to either reschedule or if able, we will work you back into the schedule. There will be a wait time to work you back in the schedule,  depending on availability.  - If you are unable to make it to your appointment as scheduled, please call 24 hours ahead of time to allow [place] to fill the time slot with someone else who needs to be seen. If you do not cancel your appointment ahead of time, you may be charged a no show fee.

## 2020-10-09 NOTE — Progress Notes (Signed)
Patient ID: Vanessa Contreras, female  DOB: 08/09/1996, 25 y.o.   MRN: [id number] Patient Care Team    Relationship Specialty Notifications Start End  Ma Hillock, DO PCP - General Family Medicine  10/09/20   Harlene Salts, MD (Inactive) Consulting Physician Emergency Medicine  05/06/16   Carylon Perches, MD Attending Physician Pediatrics  05/06/16     Chief Complaint  Patient presents with  . Establish Care  . Migraine    Subjective: Vanessa Contreras is a 25 y.o.  female present for new patient establishment/acute visit. All past medical history, surgical history, allergies, family history, immunizations, medications and social history were updated in the electronic medical record today. All recent labs, ED visits and hospitalizations within the last year were reviewed.  Migraine:  Pt reports she started having migraines when she was about 36 or 25 yrs old. She had been elevated by neurology at that time (Dr. Jannifer Franklin and ped neuro) for possible causes with normal EEG and work up at that time. She reports since onset her migraines have been associated with unilateral paraesthesia, weakness and Aura. Symptoms will affect her face, arm and sometimes leg. Initially, her headaches started on the left side of her head (and body) and now present on either right or left side. She states she use to only have a migraine 1-2x a year. Over the last 6 months she has had an increase in her headaches to 1-2 a month. She denies any medication changes over this time. She is prescribed BCP by prior PCP. She has been on the same BCP for over 2 yrs. Her mother also has a h/o chronic  migraines. She reports she has never been tried on any medications. She usually will take an NSAID and lay down.   Depression screen [facility] 2/9 10/09/2020  Decreased Interest 0  Down, Depressed, Hopeless 0  PHQ - 2 Score 0   No flowsheet data found.      No flowsheet data found.   Immunization History  Administered Date(s)  Administered  . DTaP 08/04/1998, 10/03/1998, 11/27/1998, 09/04/1999, 05/10/2002  . Hepatitis A 07/11/2005, 08/04/2006  . Hepatitis B 11/27/1998, 02/26/1999, 09/04/1999  . HiB (PRP-OMP) 08/04/1998, 10/03/1998, 11/27/1998, 09/04/1999  . Hpv 10/05/2012, 12/15/2012, 04/26/2013  . IPV 08/04/1998, 10/03/1998, 07/03/1999, 05/10/2002, 05/10/2003  . Influenza Split 06/02/2017  . Influenza, Seasonal, Injecte, Preservative Fre 05/21/2014  . Influenza,inj,Quad PF,6+ Mos 05/23/2015  . Influenza-Unspecified 08/07/2020  . MMR 07/03/1999, 05/10/2002  . MenQuadfi_Meningococcal Groups ACYW Conjugate 02/01/2015  . Meningococcal Conjugate 11/30/2009  . PFIZER SARS-COV-2 Vaccination 12/11/2019, 01/01/2020, 09/22/2020  . Tdap 11/30/2009  . Varicella 06/08/1999, 08/16/2011    No exam data present  Past Medical History:  Diagnosis Date  . Allergy   . Asthma    exercise induced  . Epidermal cyst of neck 03/25/2013  . Migraines   . Numbness on left side 05/11/2016   No Known Allergies Past Surgical History:  Procedure Laterality Date  . EAR CYST EXCISION Left 03/25/2013   Procedure: EXCISION OF A LEFT POSTERIOR AURICULAR CYST;  Surgeon: Theodoro Kos, DO;  Location: [facility];  Service: Plastics;  Laterality: Left;  . HAND SURGERY     giant cell tumor removed lt hand   Family History  Problem Relation Age of Onset  . Migraines Mother   . Miscarriages / [place] Mother   . Depression Maternal Grandmother   . Anxiety disorder Maternal Grandmother   . Cancer Father    Social  History   Social History Narrative   Marital status/children/pets: Single   Education/employment: grad Administrator:      -Wears a bicycle helmet riding a bike: Yes     -smoke alarm in the home:Yes     - wears seatbelt: Yes     - Feels safe in their relationships: Yes    Allergies as of 10/09/2020   No Known Allergies     Medication List       Accurate as of October 09, 2020 11:06 AM. If you  have any questions, ask your nurse or doctor.        STOP taking these medications   cetirizine 10 MG tablet Commonly known as: ZYRTEC Stopped by: Howard Pouch, DO   fluticasone furoate-vilanterol 100-25 MCG/INH Aepb Commonly known as: Breo Ellipta Stopped by: Howard Pouch, DO     TAKE these medications   albuterol 108 (90 Base) MCG/ACT inhaler Commonly known as: VENTOLIN HFA Inhale 2 puffs into the lungs every 6 (six) hours as needed for wheezing. Reported on 04/05/2016   Vanessa Contreras 3-0.02 MG tablet Generic drug: drospirenone-ethinyl estradiol Take 1 tablet by mouth daily.   rizatriptan 5 MG tablet Commonly known as: Maxalt Take 1 tablet (5 mg total) by mouth as needed for migraine. May repeat in 2 hours if needed Started by: Howard Pouch, DO       All past medical history, surgical history, allergies, family history, immunizations andmedications were updated in the EMR today and reviewed under the history and medication portions of their EMR.    No results found for this or any previous visit (from the past 2160 hour(s)).   ROS: 14 pt review of systems performed and negative (unless mentioned in an HPI)  Objective: BP 106/70 (BP Location: Left Arm, Cuff Size: Normal)   Pulse 71   Temp 98.5 F (36.9 C) (Oral)   Resp 12   Ht _0  (1.702 m)   Wt 131 lb (59.4 kg)   LMP 09/25/2020   SpO2 99%   BMI 20.52 kg/m  Gen: Afebrile. No acute distress. Nontoxic in appearance, well-developed, well-nourished,  Pleasant female.  HENT: AT. [place].  no Cough on exam, no hoarseness on exam. Eyes:Pupils Equal Round Reactive to light, Extraocular movements intact,  Conjunctiva without redness, discharge or icterus. Neck/lymp/endocrine: Supple,no lymphadenopathy, no thyromegaly CV: RRR no murmur, no edema Chest: CTAB, no wheeze, rhonchi or crackles. Abd: Soft. NTND. BS present. no Masses palpated.  Skin: no rashes, purpura or petechiae. Warm and well-perfused. Skin intact. Neuro/Msk: Normal  gait. PERLA. EOMi. Alert. Oriented x3.   Psych: Normal affect, dress and demeanor. Normal speech. Normal thought content and judgment.  Assessment/plan: Vanessa Contreras is a 25 y.o. female present for est care/migraines Establishing care with new doctor, encounter for Hemiplegic migraine without status migrainosus, not intractable More frequent migraine headaches- symptoms same as prior - reassuring neuro eval at onset was without worrisome findings.  Start b12 100 mcg Qd Start magnesium supplement 250-485m qd maxalt abortive therapy  Discussed and prescribed.  F/u 8 weeks with CPE/CMA to re-eval hdx w/ above changes and consider labs/referral at that itme.  Return in about 8 weeks (around 12/04/2020) for CPE (30 min), [facility] (30 min).  No orders of the defined types were placed in this encounter.  Meds ordered this encounter  Medications  . DISCONTD: rizatriptan (MAXALT) 5 MG tablet    Sig: Take 1 tablet (5 mg total) by mouth as needed for migraine. May  repeat in 2 hours if needed    Dispense:  10 tablet    Refill:  0  . rizatriptan (MAXALT) 5 MG tablet    Sig: Take 1 tablet (5 mg total) by mouth as needed for migraine. May repeat in 2 hours if needed    Dispense:  10 tablet    Refill:  5   Referral Orders  No referral(s) requested today     Note is dictated utilizing voice recognition software. Although note has been proof read prior to signing, occasional typographical errors still can be missed. If any questions arise, please do not hesitate to call for verification.  Electronically signed by: Howard Pouch, DO [place]

## 2020-11-07 ENCOUNTER — Encounter: Payer: Self-pay | Admitting: Family Medicine

## 2020-11-07 MED ORDER — NIKKI 3-0.02 MG PO TABS: 1.0000 | ORAL_TABLET | Freq: Every day | ORAL | 11 refills | Status: DC

## 2020-11-07 NOTE — Telephone Encounter (Signed)
I went ahead and refilled her birth control for her.

## 2020-12-06 ENCOUNTER — Ambulatory Visit (INDEPENDENT_AMBULATORY_CARE_PROVIDER_SITE_OTHER): Payer: BC Managed Care – PPO | Admitting: Family Medicine

## 2020-12-06 ENCOUNTER — Other Ambulatory Visit: Payer: Self-pay

## 2020-12-06 ENCOUNTER — Encounter: Payer: Self-pay | Admitting: Family Medicine

## 2020-12-06 VITALS — BP 102/64 | HR 67 | Temp 98.7°F | Ht 66.0 in | Wt 131.0 lb

## 2020-12-06 DIAGNOSIS — Z0000 Encounter for general adult medical examination without abnormal findings: Secondary | ICD-10-CM

## 2020-12-06 DIAGNOSIS — R350 Frequency of micturition: Secondary | ICD-10-CM

## 2020-12-06 DIAGNOSIS — Z131 Encounter for screening for diabetes mellitus: Secondary | ICD-10-CM

## 2020-12-06 DIAGNOSIS — Z130 Encounter for screening for diseases of the blood and blood-forming organs and certain disorders involving the immune mechanism: Secondary | ICD-10-CM | POA: Diagnosis not present

## 2020-12-06 DIAGNOSIS — G43409 Hemiplegic migraine, not intractable, without status migrainosus: Secondary | ICD-10-CM

## 2020-12-06 DIAGNOSIS — Z113 Encounter for screening for infections with a predominantly sexual mode of transmission: Secondary | ICD-10-CM

## 2020-12-06 DIAGNOSIS — Z23 Encounter for immunization: Secondary | ICD-10-CM

## 2020-12-06 DIAGNOSIS — Z114 Encounter for screening for human immunodeficiency virus [HIV]: Secondary | ICD-10-CM

## 2020-12-06 DIAGNOSIS — Z793 Long term (current) use of hormonal contraceptives: Secondary | ICD-10-CM

## 2020-12-06 DIAGNOSIS — Z1159 Encounter for screening for other viral diseases: Secondary | ICD-10-CM

## 2020-12-06 LAB — CBC WITH DIFFERENTIAL/PLATELET
Basophils Absolute: 0 10*3/uL (ref 0.0–0.1)
Basophils Relative: 1 % (ref 0.0–3.0)
Eosinophils Absolute: 0.2 10*3/uL (ref 0.0–0.7)
Eosinophils Relative: 6 % — ABNORMAL HIGH (ref 0.0–5.0)
HCT: 42.3 % (ref 36.0–46.0)
Hemoglobin: 14.2 g/dL (ref 12.0–15.0)
Lymphocytes Relative: 41.8 % (ref 12.0–46.0)
Lymphs Abs: 1.7 10*3/uL (ref 0.7–4.0)
MCHC: 33.7 g/dL (ref 30.0–36.0)
MCV: 88.8 fl (ref 78.0–100.0)
Monocytes Absolute: 0.4 10*3/uL (ref 0.1–1.0)
Monocytes Relative: 9.3 % (ref 3.0–12.0)
Neutro Abs: 1.7 10*3/uL (ref 1.4–7.7)
Neutrophils Relative %: 41.9 % — ABNORMAL LOW (ref 43.0–77.0)
Platelets: 340 10*3/uL (ref 150.0–400.0)
RBC: 4.76 Mil/uL (ref 3.87–5.11)
RDW: 13 % (ref 11.5–15.5)
WBC: 4.1 10*3/uL (ref 4.0–10.5)

## 2020-12-06 LAB — COMPREHENSIVE METABOLIC PANEL
ALT: 14 U/L (ref 0–35)
AST: 18 U/L (ref 0–37)
Albumin: 3.9 g/dL (ref 3.5–5.2)
Alkaline Phosphatase: 41 U/L (ref 39–117)
BUN: 10 mg/dL (ref 6–23)
CO2: 25 mEq/L (ref 19–32)
Calcium: 9.4 mg/dL (ref 8.4–10.5)
Chloride: 103 mEq/L (ref 96–112)
Creatinine, Ser: 0.74 mg/dL (ref 0.40–1.20)
GFR: 114.78 mL/min (ref >60.00)
Glucose, Bld: 81 mg/dL (ref 70–99)
Potassium: 4.2 mEq/L (ref 3.5–5.1)
Sodium: 134 mEq/L — ABNORMAL LOW (ref 135–145)
Total Bilirubin: 0.4 mg/dL (ref 0.2–1.2)
Total Protein: 7 g/dL (ref 6.0–8.3)

## 2020-12-06 LAB — VITAMIN B12: Vitamin B-12: 959 pg/mL — ABNORMAL HIGH (ref 211–911)

## 2020-12-06 LAB — LIPID PANEL
Cholesterol: 171 mg/dL (ref 0–200)
HDL: 58.5 mg/dL (ref >39.00)
LDL Cholesterol: 97 mg/dL (ref 0–99)
NonHDL: 112.06
Total CHOL/HDL Ratio: 3
Triglycerides: 77 mg/dL (ref 0.0–149.0)
VLDL: 15.4 mg/dL (ref 0.0–40.0)

## 2020-12-06 LAB — TSH: TSH: 2.51 u[IU]/mL (ref 0.35–4.50)

## 2020-12-06 LAB — MAGNESIUM: Magnesium: 1.9 mg/dL (ref 1.5–2.5)

## 2020-12-06 LAB — HEMOGLOBIN A1C: Hgb A1c MFr Bld: 5.4 % (ref 4.6–6.5)

## 2020-12-06 MED ORDER — HYDROXYZINE HCL 10 MG PO TABS: 10.0000 mg | ORAL_TABLET | Freq: Every day | ORAL | 11 refills | Status: DC | PRN

## 2020-12-06 NOTE — Patient Instructions (Signed)
Health Maintenance, Female Adopting a healthy lifestyle and getting preventive care are important in promoting health and wellness. Ask your health care provider about:  The right schedule for you to have regular tests and exams.  Things you can do on your own to prevent diseases and keep yourself healthy. What should I know about diet, weight, and exercise? Eat a healthy diet  Eat a diet that includes plenty of vegetables, fruits, low-fat dairy products, and lean protein.  Do not eat a lot of foods that are high in solid fats, added sugars, or sodium.   Maintain a healthy weight Body mass index (BMI) is used to identify weight problems. It estimates body fat based on height and weight. Your health care provider can help determine your BMI and help you achieve or maintain a healthy weight. Get regular exercise Get regular exercise. This is one of the most important things you can do for your health. Most adults should:  Exercise for at least 150 minutes each week. The exercise should increase your heart rate and make you sweat (moderate-intensity exercise).  Do strengthening exercises at least twice a week. This is in addition to the moderate-intensity exercise.  Spend less time sitting. Even light physical activity can be beneficial. Watch cholesterol and blood lipids Have your blood tested for lipids and cholesterol at 23 years of age, then have this test every 5 years. Have your cholesterol levels checked more often if:  Your lipid or cholesterol levels are high.  You are older than 23 years of age.  You are at high risk for heart disease. What should I know about cancer screening? Depending on your health history and family history, you may need to have cancer screening at various ages. This may include screening for:  Breast cancer.  Cervical cancer.  Colorectal cancer.  Skin cancer.  Lung cancer. What should I know about heart disease, diabetes, and high blood  pressure? Blood pressure and heart disease  High blood pressure causes heart disease and increases the risk of stroke. This is more likely to develop in people who have high blood pressure readings, are of African descent, or are overweight.  Have your blood pressure checked: ? Every 3-5 years if you are 18-39 years of age. ? Every year if you are 40 years old or older. Diabetes Have regular diabetes screenings. This checks your fasting blood sugar level. Have the screening done:  Once every three years after age 40 if you are at a normal weight and have a low risk for diabetes.  More often and at a younger age if you are overweight or have a high risk for diabetes. What should I know about preventing infection? Hepatitis B If you have a higher risk for hepatitis B, you should be screened for this virus. Talk with your health care provider to find out if you are at risk for hepatitis B infection. Hepatitis C Testing is recommended for:  Everyone born from 1945 through 1965.  Anyone with known risk factors for hepatitis C. Sexually transmitted infections (STIs)  Get screened for STIs, including gonorrhea and chlamydia, if: ? You are sexually active and are younger than 24 years of age. ? You are older than 24 years of age and your health care provider tells you that you are at risk for this type of infection. ? Your sexual activity has changed since you were last screened, and you are at increased risk for chlamydia or gonorrhea. Ask your health care provider   if you are at risk.  Ask your health care provider about whether you are at high risk for HIV. Your health care provider may recommend a prescription medicine to help prevent HIV infection. If you choose to take medicine to prevent HIV, you should first get tested for HIV. You should then be tested every 3 months for as long as you are taking the medicine. Pregnancy  If you are about to stop having your period (premenopausal) and  you may become pregnant, seek counseling before you get pregnant.  Take 400 to 800 micrograms (mcg) of folic acid every day if you become pregnant.  Ask for birth control (contraception) if you want to prevent pregnancy. Osteoporosis and menopause Osteoporosis is a disease in which the bones lose minerals and strength with aging. This can result in bone fractures. If you are 65 years old or older, or if you are at risk for osteoporosis and fractures, ask your health care provider if you should:  Be screened for bone loss.  Take a calcium or vitamin D supplement to lower your risk of fractures.  Be given hormone replacement therapy (HRT) to treat symptoms of menopause. Follow these instructions at home: Lifestyle  Do not use any products that contain nicotine or tobacco, such as cigarettes, e-cigarettes, and chewing tobacco. If you need help quitting, ask your health care provider.  Do not use street drugs.  Do not share needles.  Ask your health care provider for help if you need support or information about quitting drugs. Alcohol use  Do not drink alcohol if: ? Your health care provider tells you not to drink. ? You are pregnant, may be pregnant, or are planning to become pregnant.  If you drink alcohol: ? Limit how much you use to 0-1 drink a day. ? Limit intake if you are breastfeeding.  Be aware of how much alcohol is in your drink. In the [place] one drink equals one 12 oz bottle of beer (355 mL), one 5 oz glass of wine (148 mL), or one 1 oz glass of hard liquor (44 mL). General instructions  Schedule regular health, dental, and eye exams.  Stay current with your vaccines.  Tell your health care provider if: ? You often feel depressed. ? You have ever been abused or do not feel safe at home. Summary  Adopting a healthy lifestyle and getting preventive care are important in promoting health and wellness.  Follow your health care provider's instructions about healthy  diet, exercising, and getting tested or screened for diseases.  Follow your health care provider's instructions on monitoring your cholesterol and blood pressure. This information is not intended to replace advice given to you by your health care provider. Make sure you discuss any questions you have with your health care provider. Document Revised: 09/16/2018 Document Reviewed: 09/16/2018 Elsevier Patient Education  2021 [employer]  

## 2020-12-06 NOTE — Progress Notes (Signed)
This visit occurred during the SARS-CoV-2 public health emergency.  Safety protocols were in place, including screening questions prior to the visit, additional usage of staff PPE, and extensive cleaning of exam room while observing appropriate contact time as indicated for disinfecting solutions.    Patient ID: Vanessa Contreras, female  DOB: June 18, 1996, 25 y.o.   MRN: [id number] Patient Care Team    Relationship Specialty Notifications Start End  Vanessa Hillock, DO PCP - General Family Medicine  10/09/20   Vanessa Salts, MD (Inactive) Consulting Physician Emergency Medicine  05/06/16   Vanessa Perches, MD Attending Physician Pediatrics  05/06/16     Chief Complaint  Patient presents with  . Annual Exam    Pt is fasting; making appt with GYN for pap    Subjective:  Vanessa Contreras is a 25 y.o.  Female  present for CPE. All past medical history, surgical history, allergies, family history, immunizations, medications and social history were updated in the electronic medical record today. All recent labs, ED visits and hospitalizations within the last year were reviewed.  Health maintenance:  Colonoscopy: no fhx. Start screen at 63 Mammogram: no fhx. Routine screen at 42 Cervical cancer screening: making an appt to establish at gyn> never SA Immunizations: tdap - updated tpday, Influenza UTD 2021(encouraged yearly), covid series completed x3. HPV series completed.  Infectious disease screening: HIV and  Hep C > never SA- not indicated DEXA:routine screen.  Assistive device: none Oxygen SAY:TKZS Patient has a Dental home. Hospitalizations/ED visits: reviewed  Chronic frequent urination: Pt reports chronic frequent urination for "as long as she can remember." She will feel the urge to use the bathroom about every 30-60 min. She drinks about 60 ounces of water a day. She endorses only producing a small amount of urine each episode. She denies any infectious symptoms and she has never been  Sexually active.   Migraine:  She states she is still having 1-2 migraines a month. She reports they have changed over the last 8 weeks and she is no loger getting the aura with her migraines. She does endorse more pain w/ the headaches though. The headaches respond in about an hour to maxalt, but sometimes return the next day and she repeats the maxalt. Using about 4 tabs monthly. She has started the b12 and magnesium supplements.  Prior note: Pt reports she started having migraines when she was about 60 or 25 yrs old. She had been elevated by neurology at that time (Dr. Jannifer Franklin and ped neuro) for possible causes with normal EEG and work up at that time. She reports since onset her migraines have been associated with unilateral paraesthesia, weakness and Aura. Symptoms will affect her face, arm and sometimes leg. Initially, her headaches started on the left side of her head (and body) and now present on either right or left side. She states she use to only have a migraine 1-2x a year. Over the last 6 months she has had an increase in her headaches to 1-2 a month. She denies any medication changes over this time. She is prescribed BCP by prior PCP. She has been on the same BCP for over 2 yrs. Her mother also has a h/o chronic  migraines. She reports she has never been tried on any medications. She usually will take an NSAID and lay down.   Depression screen [facility] 2/9 12/06/2020 10/09/2020  Decreased Interest 0 0  Down, Depressed, Hopeless 0 0  PHQ - 2 Score 0 0  No flowsheet data found.   Immunization History  Administered Date(s) Administered  . DTaP 08/04/1998, 10/03/1998, 11/27/1998, 09/04/1999, 05/10/2002  . Hepatitis A 07/11/2005, 08/04/2006  . Hepatitis B 11/27/1998, 02/26/1999, 09/04/1999  . HiB (PRP-OMP) 08/04/1998, 10/03/1998, 11/27/1998, 09/04/1999  . Hpv-Unspecified 10/05/2012, 12/15/2012, 04/26/2013  . IPV 08/04/1998, 10/03/1998, 07/03/1999, 05/10/2002, 05/10/2003  . Influenza Split  06/02/2017  . Influenza, Seasonal, Injecte, Preservative Fre 05/21/2014  . Influenza,inj,Quad PF,6+ Mos 05/23/2015  . Influenza-Unspecified 08/07/2020  . MMR 07/03/1999, 05/10/2002  . MenQuadfi_Meningococcal Groups ACYW Conjugate 02/01/2015  . Meningococcal Conjugate 11/30/2009  . PFIZER(Purple Top)SARS-COV-2 Vaccination 12/11/2019, 01/01/2020, 09/22/2020  . Tdap 11/30/2009, 12/06/2020  . Varicella 06/08/1999, 08/16/2011     Past Medical History:  Diagnosis Date  . Allergy   . Asthma    exercise induced  . Epidermal cyst of neck 03/25/2013  . Migraines   . Numbness on left side 05/11/2016   No Known Allergies Past Surgical History:  Procedure Laterality Date  . EAR CYST EXCISION Left 03/25/2013   Procedure: EXCISION OF A LEFT POSTERIOR AURICULAR CYST;  Surgeon: Theodoro Kos, DO;  Location: [facility];  Service: Plastics;  Laterality: Left;  . HAND SURGERY     giant cell tumor removed lt hand   Family History  Problem Relation Age of Onset  . Migraines Mother   . Miscarriages / [place] Mother   . Depression Maternal Grandmother   . Anxiety disorder Maternal Grandmother   . Testicular cancer Father    Social History   Social History Narrative   Marital status/children/pets: Single   Education/employment: grad Administrator:      -Wears a bicycle helmet riding a bike: Yes     -smoke alarm in the home:Yes     - wears seatbelt: Yes     - Feels safe in their relationships: Yes    Allergies as of 12/06/2020   No Known Allergies     Medication List       Accurate as of December 06, 2020  9:03 AM. If you have any questions, ask your nurse or doctor.        albuterol 108 (90 Base) MCG/ACT inhaler Commonly known as: VENTOLIN HFA Inhale 2 puffs into the lungs every 6 (six) hours as needed for wheezing. Reported on 04/05/2016   hydrOXYzine 10 MG tablet Commonly known as: ATARAX/VISTARIL Take 1-3 tablets (10-30 mg total) by mouth daily as  needed. Started by: Howard Pouch, DO   Vanessa Contreras 3-0.02 MG tablet Generic drug: drospirenone-ethinyl estradiol Take 1 tablet by mouth daily.   rizatriptan 5 MG tablet Commonly known as: Maxalt Take 1 tablet (5 mg total) by mouth as needed for migraine. May repeat in 2 hours if needed       All past medical history, surgical history, allergies, family history, immunizations andmedications were updated in the EMR today and reviewed under the history and medication portions of their EMR.     No results found for this or any previous visit (from the past 2160 hour(s)).   ROS: 14 pt review of systems performed and negative (unless mentioned in an HPI)  Objective: BP 102/64   Pulse 67   Temp 98.7 F (37.1 C) (Oral)   Ht $R'5\' 6"'Ix$  (1.676 m)   Wt 131 lb (59.4 kg)   SpO2 100%   BMI 21.14 kg/m  Gen: Afebrile. No acute distress. Nontoxic in appearance, well-developed, well-nourished,  Pleasant female.  HENT: AT. [place]. Bilateral TM visualized and normal in appearance,  normal external auditory canal. MMM, no oral lesions, adequate dentition. Bilateral nares within normal limits. Throat without erythema, ulcerations or exudates. no Cough on exam, no hoarseness on exam. Eyes:Pupils Equal Round Reactive to light, Extraocular movements intact,  Conjunctiva without redness, discharge or icterus. Neck/lymp/endocrine: Supple,no lymphadenopathy, no thyromegaly CV: RRR no murmur, no edema, +2/4 P posterior tibialis pulses.  Chest: CTAB, no wheeze, rhonchi or crackles. normal Respiratory effort. good Air movement. Abd: Soft. flat. NTND. BS present. no Masses palpated. No hepatosplenomegaly. No rebound tenderness or guarding. Skin: no rashes, purpura or petechiae. Warm and well-perfused. Skin intact. Neuro/Msk:  Normal gait. PERLA. EOMi. Alert. Oriented x3.  Cranial nerves II through XII intact. Muscle strength 5/5 upper/lower extremity. DTRs equal bilaterally. Psych: Normal affect, dress and demeanor. Normal  speech. Normal thought content and judgment.   No exam data present  Assessment/plan: RANEEM MENDOLIA is a 25 y.o. female present for CPE/CMC Hemiplegic migraine without status migrainosus, not intractable - stable.  - reassuring neuro eval at onset was without worrisome findings.  Continue  b12 1000 mcg Qd Continue  magnesium supplement 250-450mg  qd Continue maxalt abortive therapy  B12, iron, tsh and mag levels checked today F/u yearly with CPE, sooner if worsening   Need for Tdap vaccination - Tdap vaccine greater than or equal to 7yo IM Long term current use of hormonal contraceptive Continue bcp - Comprehensive metabolic panel - Lipid panel Diabetes mellitus screening - Hemoglobin A1c Screening for deficiency anemia - CBC with Differential/Platelet Need for hepatitis C screening test/Encounter for screening for HIV/Screen for STD (sexually transmitted disease) Not indicated- never SA  Frequent urination Discussed potential causes bladder spasm vs small bladder vs incomplete emptying etc. Unlikely infectious given chronic condition. Will collect urinalysis to be complete.  - discussed referral to urology for bladder studies and she would like to wait for now. Ok to refer if she changes her mind  - Urinalysis w microscopic + reflex cultur Routine general medical examination at a health care facility Patient was encouraged to exercise greater than 150 minutes a week. Patient was encouraged to choose a diet filled with fresh fruits and vegetables, and lean meats. AVS provided to patient today for education/recommendation on gender specific health and safety maintenance. Colonoscopy: no fhx. Start screen at 71 Mammogram: no fhx. Routine screen at 42 Cervical cancer screening: making an appt to establish at gyn> never SA Immunizations: tdap - updated tpday, Influenza UTD 2021(encouraged yearly), covid series completed x3. HPV series completed.  Infectious disease screening: HIV  and  Hep C > never SA- not indicated DEXA:routine screen.  Return in about 1 year (around 12/06/2021) for CPE (30 min).  Orders Placed This Encounter  Procedures  . Tdap vaccine greater than or equal to 7yo IM  . CBC with Differential/Platelet  . Comprehensive metabolic panel  . Hemoglobin A1c  . Lipid panel  . TSH  . Iron, TIBC and Ferritin Panel  . B12  . Magnesium  . Urinalysis w microscopic + reflex cultur    Meds ordered this encounter  Medications  . hydrOXYzine (ATARAX/VISTARIL) 10 MG tablet    Sig: Take 1-3 tablets (10-30 mg total) by mouth daily as needed.    Dispense:  90 tablet    Refill:  11   Referral Orders  No referral(s) requested today     Electronically signed by: Howard Pouch, [place]

## 2020-12-07 LAB — IRON,TIBC AND FERRITIN PANEL
%SAT: 16 % (calc) (ref 16–45)
Ferritin: 9 ng/mL — ABNORMAL LOW (ref 16–154)
Iron: 68 ug/dL (ref 40–190)
TIBC: 427 mcg/dL (calc) (ref 250–450)

## 2021-01-07 ENCOUNTER — Other Ambulatory Visit: Payer: Self-pay | Admitting: Family Medicine

## 2021-03-06 ENCOUNTER — Encounter: Payer: Self-pay | Admitting: Family Medicine

## 2021-03-06 ENCOUNTER — Ambulatory Visit: Payer: BC Managed Care – PPO | Admitting: Allergy

## 2021-03-06 ENCOUNTER — Other Ambulatory Visit: Payer: Self-pay

## 2021-03-06 ENCOUNTER — Encounter: Payer: Self-pay | Admitting: Allergy

## 2021-03-06 VITALS — BP 96/70 | HR 88 | Temp 98.2°F | Resp 16 | Ht 67.0 in | Wt 129.7 lb

## 2021-03-06 DIAGNOSIS — J4520 Mild intermittent asthma, uncomplicated: Secondary | ICD-10-CM | POA: Diagnosis not present

## 2021-03-06 DIAGNOSIS — H1013 Acute atopic conjunctivitis, bilateral: Secondary | ICD-10-CM

## 2021-03-06 DIAGNOSIS — R350 Frequency of micturition: Secondary | ICD-10-CM

## 2021-03-06 DIAGNOSIS — J3089 Other allergic rhinitis: Secondary | ICD-10-CM

## 2021-03-06 MED ORDER — EPINEPHRINE 0.3 MG/0.3ML IJ SOAJ: 0.3000 mg | INTRAMUSCULAR | 1 refills | Status: DC | PRN

## 2021-03-06 MED ORDER — MONTELUKAST SODIUM 10 MG PO TABS: 10.0000 mg | ORAL_TABLET | Freq: Every day | ORAL | 5 refills | Status: DC

## 2021-03-06 NOTE — Assessment & Plan Note (Signed)
Perennial rhinoconjunctivitis symptoms for 20+ years with worsening in the spring and summer.  Patient was on allergy immunotherapy 2 separate times for 5 years each with good benefit.  Currently on Zyrtec and Singulair with good benefit. 2 dogs, 1 rabbit, 4 birds, 1 [place] pig at home.  Today's skin testing showed: Positive to trees, weed pollen, mold, dust mites, cat, dog, rabbit, [place] pig.  Requesting records.   Start environmental control measures as below.  Use over the counter antihistamines such as Zyrtec (cetirizine), Claritin (loratadine), Allegra (fexofenadine), or Xyzal (levocetirizine) daily as needed. May take twice a day during allergy flares. May switch antihistamines every few months.  Continue Singulair (montelukast) 10mg  daily at night. Start allergy injections. Had a detailed discussion with patient/family that clinical history is suggestive of allergic rhinitis, and may benefit from allergy immunotherapy (AIT). Discussed in detail regarding the dosing, schedule, side effects (mild to moderate local allergic reaction and rarely systemic allergic reactions including anaphylaxis), and benefits (significant improvement in nasal symptoms, seasonal flares of asthma) of immunotherapy with the patient. There is significant time commitment involved with allergy shots, which includes weekly immunotherapy injections for first 9-12 months and then biweekly to monthly injections for 3-5 years. Consent was signed. I have prescribed epinephrine injectable and demonstrated proper use. For mild symptoms you can take over the counter antihistamines such as Benadryl and monitor symptoms closely. If symptoms worsen or if you have severe symptoms including breathing issues, throat closure, significant swelling, whole body hives, severe diarrhea and vomiting, lightheadedness then inject epinephrine and seek immediate medical care afterwards. Action plan given. Declines nasal sprays and eye drops at  this time.

## 2021-03-06 NOTE — Assessment & Plan Note (Signed)
.   See assessment and plan as above. 

## 2021-03-06 NOTE — Assessment & Plan Note (Signed)
Diagnosed with asthma 5 years ago.  Main triggers are allergies and exercise.  Used to be on Breo but now using albuterol less than once per month with good benefit.  Today's spirometry was unremarkable given effort.  Continue Singulair (montelukast) 10mg  daily at night.  May use albuterol rescue inhaler 2 puffs every 4 to 6 hours as needed for shortness of breath, chest tightness, coughing, and wheezing. May use albuterol rescue inhaler 2 puffs 5 to 15 minutes prior to strenuous physical activities. Monitor frequency of use.

## 2021-03-06 NOTE — Progress Notes (Signed)
New Patient Note  RE: Vanessa Contreras MRN: [id number] DOB: 1996-06-01 Date of Office Visit: 03/06/2021  Consult requested by: No ref. provider found Primary care provider: Ma Hillock, DO  Chief Complaint: Nasal Congestion  History of Present Illness: I had the pleasure of seeing Gerldine Suleiman for initial evaluation at the [facility] [place] on 03/06/2021. She is a 25 y.o. female, who is self-referred here for the evaluation of allergic rhinitis.  She reports symptoms of rhinorrhea, sneezing, itchy nose, itchy/watery eyes. Symptoms have been going on for 20+ years and worsened the past few years. The symptoms are present all year around with worsening in spring and summer. Other triggers include exposure to cats, [place] pigs, rabbits, dogs. Anosmia: no. Headache: h/o migraines. She has used zyrtec, Singulair with some improvement in symptoms. Sinus infections: no. Previous work up includes: over 5 years ago and showed multiple positives. Patient was on allergy injections twice for 5 years each which helped. She did take a break but noticed that symptoms returned.  Previous ENT evaluation: no. Previous sinus imaging: no. History of nasal polyps: no. Last eye exam: last year. History of reflux: denies.  Assessment and Plan: Thanvi is a 25 y.o. female with: Other allergic rhinitis Perennial rhinoconjunctivitis symptoms for 20+ years with worsening in the spring and summer.  Patient was on allergy immunotherapy 2 separate times for 5 years each with good benefit.  Currently on Zyrtec and Singulair with good benefit. 2 dogs, 1 rabbit, 4 birds, 1 [place] pig at home.  Today's skin testing showed: Positive to trees, weed pollen, mold, dust mites, cat, dog, rabbit, [place] pig.  Requesting records.   Start environmental control measures as below.  Use over the counter antihistamines such as Zyrtec (cetirizine), Claritin (loratadine), Allegra (fexofenadine), or Xyzal (levocetirizine)  daily as needed. May take twice a day during allergy flares. May switch antihistamines every few months.  Continue Singulair (montelukast) 10mg  daily at night. Start allergy injections. Had a detailed discussion with patient/family that clinical history is suggestive of allergic rhinitis, and may benefit from allergy immunotherapy (AIT). Discussed in detail regarding the dosing, schedule, side effects (mild to moderate local allergic reaction and rarely systemic allergic reactions including anaphylaxis), and benefits (significant improvement in nasal symptoms, seasonal flares of asthma) of immunotherapy with the patient. There is significant time commitment involved with allergy shots, which includes weekly immunotherapy injections for first 9-12 months and then biweekly to monthly injections for 3-5 years. Consent was signed. I have prescribed epinephrine injectable and demonstrated proper use. For mild symptoms you can take over the counter antihistamines such as Benadryl and monitor symptoms closely. If symptoms worsen or if you have severe symptoms including breathing issues, throat closure, significant swelling, whole body hives, severe diarrhea and vomiting, lightheadedness then inject epinephrine and seek immediate medical care afterwards. Action plan given. Declines nasal sprays and eye drops at this time.  Allergic conjunctivitis of both eyes  See assessment and plan as above.  Mild intermittent asthma without complication Diagnosed with asthma 5 years ago.  Main triggers are allergies and exercise.  Used to be on Breo but now using albuterol less than once per month with good benefit.  Today's spirometry was unremarkable given effort.  Continue Singulair (montelukast) 10mg  daily at night.  May use albuterol rescue inhaler 2 puffs every 4 to 6 hours as needed for shortness of breath, chest tightness, coughing, and wheezing. May use albuterol rescue inhaler 2 puffs 5 to 15  minutes prior to  strenuous physical activities. Monitor frequency of use.   Return in about 4 months (around 07/06/2021).  Meds ordered this encounter  Medications  . montelukast (SINGULAIR) 10 MG tablet    Sig: Take 1 tablet (10 mg total) by mouth at bedtime.    Dispense:  30 tablet    Refill:  5  . EPINEPHrine (AUVI-Q) 0.3 mg/0.3 mL IJ SOAJ injection    Sig: Inject 0.3 mg into the muscle as needed for anaphylaxis.    Dispense:  1 each    Refill:  1    C: [id number]   Lab Orders  No laboratory test(s) ordered today    Other allergy screening: Asthma: yes She reports symptoms of chest tightness, shortness of breath, coughing, wheezing for 5 years. Current medications include albuterol prn which help. She reports using aerochamber with inhalers. She tried the following inhalers: Breo. Main triggers are allergies and exercise. In the last month, frequency of symptoms: <1x/week. Frequency of nocturnal symptoms: 0x/month. Frequency of SABA use: <1x/week. Interference with physical activity: rarely. Sleep is undisturbed. In the last 12 months, emergency room visits/urgent care visits/doctor office visits or hospitalizations due to respiratory issues: 0. In the last 12 months, oral steroids courses: 0. Lifetime history of hospitalization for respiratory issues: no. Prior intubations: no. History of pneumonia: no. She was evaluated by allergist in the past. Smoking exposure: denies. Up to date with flu vaccine: yes. Up to date with COVID-19 vaccine: yes. Prior Covid-19 infection: no.  Food allergy: no Medication allergy: no Hymenoptera allergy: no Urticaria: no Eczema:no History of recurrent infections suggestive of immunodeficency: no  Diagnostics: Spirometry:  Tracings reviewed. Her effort: It was hard to get consistent efforts and there is a question as to whether this reflects a maximal maneuver. FVC: 3.58L FEV1: 3.40L, 95% predicted FEV1/FVC ratio: 95% Interpretation: No overt abnormalities noted  given today's efforts.  Please see scanned spirometry results for details.  Skin Testing: Environmental allergy panel. Positive to trees, weed pollen, mold, dust mites, cat, dog, rabbit, [place] pig. Results discussed with patient/family.  Airborne Adult Perc - 03/06/21 0855    Time Antigen Placed 0900    Allergen Manufacturer Lavella Hammock    Location Back    Number of Test 59    1. Control-Buffer 50% Glycerol Negative    2. Control-Histamine 1 mg/ml 2+    3. Albumin saline Negative    4. South End Negative    5. [place] Negative    6. Johnson Negative    7. [place] Blue Negative    8. Meadow Fescue Negative    9. Perennial Rye Negative    10. Sweet Vernal Negative    11. Timothy Negative    12. Cocklebur Negative    13. Burweed Marshelder Negative    14. Ragweed, short Negative    15. Ragweed, Giant Negative    16. Plantain,  English Negative    17. Lamb's Quarters Negative    18. Sheep Sorrell Negative    19. Rough Pigweed Negative    20. Marsh Elder, Rough Negative    21. Mugwort, Common Negative    22. Ash mix Negative    23. Birch mix 2+    24. Beech American Negative    25. Box, Elder Negative    26. Cedar, red Negative    27. Cottonwood, Eastern 2+    28. Elm mix Negative    29. Hickory 4+    30. Maple mix 2+    31.  Oak, [place] mix 2+    32. Pecan Pollen Negative    33. Pine mix Negative    34. Sycamore Eastern 2+    35. [place], Black Pollen Negative    36. Alternaria alternata Negative    37. Cladosporium Herbarum Negative    38. Aspergillus mix Negative    39. Penicillium mix Negative    40. Bipolaris sorokiniana (Helminthosporium) 2+    41. Drechslera spicifera (Curvularia) 2+    42. Mucor plumbeus Negative    43. Fusarium moniliforme 2+    44. Aureobasidium pullulans (pullulara) Negative    45. Rhizopus oryzae Negative    46. Botrytis cinera Negative    47. Epicoccum nigrum 2+    48. Phoma betae Negative    49. Candida Albicans Negative    50. Trichophyton  mentagrophytes Negative    51. Mite, D Farinae  5,000 AU/ml 2+    52. Mite, D Pteronyssinus  5,000 AU/ml 2+    53. Cat Hair 10,000 BAU/ml 2+    54.  Dog Epithelia Negative    55. Mixed Feathers Negative    56. Horse Epithelia Negative    57. Cockroach, German Negative    58. Mouse Negative    59. Tobacco Leaf Negative          Intradermal - 03/06/21 0958    Time Antigen Placed 0940    Allergen Manufacturer Lavella Hammock    Location Arm    Number of Test 10    Control Negative    [place] Negative    Johnson Negative    7 Grass Negative    Ragweed mix Negative    Weed mix 2+    Mold 1 3+    Mold 2 2+    Dog 3+    Cockroach Negative          Food Adult Perc - 03/06/21 0900    2. [place] Pig 2+    3. Rabbit 2+           Past Medical History: Patient Active Problem List   Diagnosis Date Noted  . Other allergic rhinitis 03/06/2021  . Allergic conjunctivitis of both eyes 03/06/2021  . Mild intermittent asthma without complication 21/19/4174  . Frequent urination 12/06/2020  . Moderate persistent asthma without complication 05/20/4817  . 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 Allergy   . Asthma    exercise induced  . Epidermal cyst of neck 03/25/2013  . Migraines   . Numbness on left side 05/11/2016   Past Surgical History: Past Surgical History:  Procedure Laterality Date  . EAR CYST EXCISION Left 03/25/2013   Procedure: EXCISION OF A LEFT POSTERIOR AURICULAR CYST;  Surgeon: Theodoro Kos, DO;  Location: [facility];  Service: Plastics;  Laterality: Left;  . HAND SURGERY     giant cell tumor removed lt hand   Medication List:  Current Outpatient Medications  Medication Sig Dispense Refill  . albuterol (PROVENTIL HFA;VENTOLIN HFA) 108 (90 Base) MCG/ACT inhaler Inhale 2 puffs into the lungs every 6 (six) hours as needed for wheezing. Reported on 04/05/2016 1 Inhaler 5  .  EPINEPHrine (AUVI-Q) 0.3 mg/0.3 mL IJ SOAJ injection Inject 0.3 mg into the muscle as needed for anaphylaxis. 1 each 1  . hydrOXYzine (ATARAX/VISTARIL) 10 MG tablet TAKE 1-3 TABLETS (10-30 MG TOTAL) BY MOUTH DAILY AS NEEDED. 270 tablet 0  . montelukast (SINGULAIR) 10 MG tablet Take 1 tablet (  10 mg total) by mouth at bedtime. 30 tablet 5  . NIKKI 3-0.02 MG tablet Take 1 tablet by mouth daily. 28 tablet 11  . rizatriptan (MAXALT) 5 MG tablet Take 1 tablet (5 mg total) by mouth as needed for migraine. May repeat in 2 hours if needed 10 tablet 5   No current facility-administered medications for this visit.   Allergies: No Known Allergies Social History: Social History   Socioeconomic History  . Marital status: Single    Spouse name: Not on file  . Number of children: Not on file  . Years of education: Not on file  . Highest education level: Not on file  Occupational History  . Not on file  Tobacco Use  . Smoking status: Never Smoker  . Smokeless tobacco: Never Used  Vaping Use  . Vaping Use: Never used  Substance and Sexual Activity  . Alcohol use: No  . Drug use: No  . Sexual activity: Never  Other Topics Concern  . Not on file  Social History Narrative   Marital status/children/pets: Single   Education/employment: grad Administrator:      -Wears a bicycle helmet riding a bike: Yes     -smoke alarm in the home:Yes     - wears seatbelt: Yes     - Feels safe in their relationships: Yes   Social Determinants of Health   Financial Resource Strain: Not on file  Food Insecurity: Not on file  Transportation Needs: Not on file  Physical Activity: Not on file  Stress: Not on file  Social Connections: Not on file   Lives in a house which is 74+ years old. Smoking: denies Occupation: Psychiatric nurse History: Environmental education officer in the house: no Carpet in the family room: no Carpet in the bedroom: no Heating: gas Cooling: central Pet: yes 2  dogs, 4 birds, 1 rabbit, 1 [place] pig  Family History: Family History  Problem Relation Age of Onset  . Migraines Mother   . Miscarriages / [place] Mother   . Allergic rhinitis Mother   . Allergic rhinitis Sister   . Allergic rhinitis Brother   . Depression Maternal Grandmother   . Anxiety disorder Maternal Grandmother   . Allergic rhinitis Sister   . Allergic rhinitis Sister   . Testicular cancer Father    Review of Systems  Constitutional: Negative for appetite change, chills, fever and unexpected weight change.  HENT: Positive for rhinorrhea and sneezing. Negative for congestion.   Eyes: Positive for itching.  Respiratory: Negative for cough, chest tightness, shortness of breath and wheezing.   Cardiovascular: Negative for chest pain.  Gastrointestinal: Negative for abdominal pain.  Genitourinary: Negative for difficulty urinating.  Skin: Negative for rash.  Allergic/Immunologic: Positive for environmental allergies.  Neurological: Positive for headaches.   Objective: BP 96/70   Pulse 88   Temp 98.2 F (36.8 C) (Temporal)   Resp 16   Ht 5\' 7"  (1.702 m)   Wt 129 lb 11.2 oz (58.8 kg)   SpO2 100%   BMI 20.31 kg/m  Body mass index is 20.31 kg/m. Physical Exam Vitals and nursing note reviewed.  Constitutional:      Appearance: Normal appearance. She is well-developed.  HENT:     Head: Normocephalic and atraumatic.     Right Ear: Tympanic membrane and external ear normal.     Left Ear: Tympanic membrane and external ear normal.     Nose: Nose normal.     Mouth/Throat:  Mouth: Mucous membranes are moist.     Pharynx: Oropharynx is clear.  Eyes:     Conjunctiva/sclera: Conjunctivae normal.  Cardiovascular:     Rate and Rhythm: Normal rate and regular rhythm.     Heart sounds: Normal heart sounds. No murmur heard. No friction rub. No gallop.   Pulmonary:     Effort: Pulmonary effort is normal.     Breath sounds: Normal breath sounds. No wheezing, rhonchi  or rales.  Musculoskeletal:     Cervical back: Neck supple.  Skin:    General: Skin is warm.     Findings: No rash.  Neurological:     Mental Status: She is alert and oriented to person, place, and time.  Psychiatric:        Behavior: Behavior normal.    The plan was reviewed with the patient/family, and all questions/concerned were addressed.  It was my pleasure to see Kinsly today and participate in her care. Please feel free to contact me with any questions or concerns.  Sincerely,  Rexene Alberts, DO Allergy & Immunology  [facility] office: [place] office: [phone]

## 2021-03-06 NOTE — Progress Notes (Signed)
Aeroallergen Immunotherapy   Ordering Provider: Dr. Rexene Alberts   Patient Details  Name: Vanessa Contreras  MRN: [id number]  Date of Birth: Nov 23, 1995   Order 2 of 2   Vial Label: M-C-D   0.2 ml (Volume) 1:20 Concentration -- Alternaria alternata  0.2 ml (Volume) 1:20 Concentration -- Cladosporium herbarum  0.2 ml (Volume) 1:10 Concentration -- Aspergillus mix  0.2 ml (Volume) 1:10 Concentration -- Penicillium mix  0.2 ml (Volume) 1:20 Concentration -- Bipolaris sorokiniana  0.2 ml (Volume) 1:20 Concentration -- Drechslera spicifera  0.2 ml (Volume) 1:10 Concentration -- Fusarium moniliforme  0.2 ml (Volume) 1:40 Concentration -- Epicoccum nigrum  0.5 ml (Volume) 1:10 Concentration -- Cat Hair  0.5 ml (Volume) 1:10 Concentration -- Dog Epithelia   2.6 ml Extract Subtotal  2.4 ml Diluent  5.0 ml Maintenance Total   Schedule: B  Blue Vial (1:100,000): Schedule B (6 doses)  Yellow Vial (1:10,000): Schedule B (6 doses)  Green Vial (1:1,000): Schedule B (6 doses)  Red Vial (1:100): Schedule A (10 doses)   Special Instructions: once per week

## 2021-03-06 NOTE — Patient Instructions (Addendum)
Today's skin testing showed: Positive to trees, weed pollen, mold, dust mites, cat, dog, rabbit, [place] pig. Results given.   Requesting records.   Environmental allergies  Start environmental control measures as below.  Use over the counter antihistamines such as Zyrtec (cetirizine), Claritin (loratadine), Allegra (fexofenadine), or Xyzal (levocetirizine) daily as needed. May take twice a day during allergy flares. May switch antihistamines every few months.  Continue Singulair (montelukast) 10mg  daily at night. Start allergy injections. Had a detailed discussion with patient/family that clinical history is suggestive of allergic rhinitis, and may benefit from allergy immunotherapy (AIT). Discussed in detail regarding the dosing, schedule, side effects (mild to moderate local allergic reaction and rarely systemic allergic reactions including anaphylaxis), and benefits (significant improvement in nasal symptoms, seasonal flares of asthma) of immunotherapy with the patient. There is significant time commitment involved with allergy shots, which includes weekly immunotherapy injections for first 9-12 months and then biweekly to monthly injections for 3-5 years. Consent was signed. I have prescribed epinephrine injectable and demonstrated proper use. For mild symptoms you can take over the counter antihistamines such as Benadryl and monitor symptoms closely. If symptoms worsen or if you have severe symptoms including breathing issues, throat closure, significant swelling, whole body hives, severe diarrhea and vomiting, lightheadedness then inject epinephrine and seek immediate medical care afterwards. Action plan given.  Asthma:  Continue Singulair (montelukast) 10mg  daily at night.  May use albuterol rescue inhaler 2 puffs every 4 to 6 hours as needed for shortness of breath, chest tightness, coughing, and wheezing. May use albuterol rescue inhaler 2 puffs 5 to 15 minutes prior to strenuous  physical activities. Monitor frequency of use.   Follow up in 4 months or sooner if needed.   Reducing Pollen Exposure . Pollen seasons: trees (spring), grass (summer) and ragweed/weeds (fall). Marland Kitchen Keep windows closed in your home and car to lower pollen exposure.  Susa Simmonds air conditioning in the bedroom and throughout the house if possible.  . Avoid going out in dry windy days - especially early morning. . Pollen counts are highest between 5 - 10 AM and on dry, hot and windy days.  . Save outside activities for late afternoon or after a heavy rain, when pollen levels are lower.  . Avoid mowing of grass if you have grass pollen allergy. Marland Kitchen Be aware that pollen can also be transported indoors on people and pets.  . Dry your clothes in an automatic dryer rather than hanging them outside where they might collect pollen.  . Rinse hair and eyes before bedtime. Mold Control . Mold and fungi can grow on a variety of surfaces provided certain temperature and moisture conditions exist.  . Outdoor molds grow on plants, decaying vegetation and soil. The major outdoor mold, Alternaria and Cladosporium, are found in very high numbers during hot and dry conditions. Generally, a late summer - fall peak is seen for common outdoor fungal spores. Rain will temporarily lower outdoor mold spore count, but counts rise rapidly when the rainy period ends. . The most important indoor molds are Aspergillus and Penicillium. Dark, humid and poorly ventilated basements are ideal sites for mold growth. The next most common sites of mold growth are the bathroom and the kitchen. Outdoor (Seasonal) Mold Control . Use air conditioning and keep windows closed. . Avoid exposure to decaying vegetation. Marland Kitchen Avoid leaf raking. . Avoid grain handling. . Consider wearing a face mask if working in moldy areas.  Indoor (Perennial) Mold Control  . Maintain  humidity below 50%. . Get rid of mold growth on hard surfaces with water,  detergent and, if necessary, 5% bleach (do not mix with other cleaners). Then dry the area completely. If mold covers an area more than 10 square feet, consider hiring an indoor environmental professional. . For clothing, washing with soap and water is best. If moldy items cannot be cleaned and dried, throw them away. . Remove sources e.g. contaminated carpets. . Repair and seal leaking roofs or pipes. Using dehumidifiers in damp basements may be helpful, but empty the water and clean units regularly to prevent mildew from forming. All rooms, especially basements, bathrooms and kitchens, require ventilation and cleaning to deter mold and mildew growth. Avoid carpeting on concrete or damp floors, and storing items in damp areas. Control of House Dust Mite Allergen . Dust mite allergens are a common trigger of allergy and asthma symptoms. While they can be found throughout the house, these microscopic creatures thrive in warm, humid environments such as bedding, upholstered furniture and carpeting. . Because so much time is spent in the bedroom, it is essential to reduce mite levels there.  . Encase pillows, mattresses, and box springs in special allergen-proof fabric covers or airtight, zippered plastic covers.  . Bedding should be washed weekly in hot water (130 F) and dried in a hot dryer. Allergen-proof covers are available for comforters and pillows that can't be regularly washed.  Wendee Copp the allergy-proof covers every few months. Minimize clutter in the bedroom. Keep pets out of the bedroom.  Marland Kitchen Keep humidity less than 50% by using a dehumidifier or air conditioning. You can buy a humidity measuring device called a hygrometer to monitor this.  . If possible, replace carpets with hardwood, linoleum, or washable area rugs. If that's not possible, vacuum frequently with a vacuum that has a HEPA filter. . Remove all upholstered furniture and non-washable window drapes from the bedroom. . Remove all  non-washable stuffed toys from the bedroom.  Wash stuffed toys weekly. Pet Allergen Avoidance: . Contrary to popular opinion, there are no "hypoallergenic" breeds of dogs or cats. That is because people are not allergic to an animal's hair, but to an allergen found in the animal's saliva, dander (dead skin flakes) or urine. Pet allergy symptoms typically occur within minutes. For some people, symptoms can build up and become most severe 8 to 12 hours after contact with the animal. People with severe allergies can experience reactions in public places if dander has been transported on the pet owners' clothing. Marland Kitchen Keeping an animal outdoors is only a partial solution, since homes with pets in the yard still have higher concentrations of animal allergens. . Before getting a pet, ask your allergist to determine if you are allergic to animals. If your pet is already considered part of your family, try to minimize contact and keep the pet out of the bedroom and other rooms where you spend a great deal of time. . As with dust mites, vacuum carpets often or replace carpet with a hardwood floor, tile or linoleum. . High-efficiency particulate air (HEPA) cleaners can reduce allergen levels over time. . While dander and saliva are the source of cat and dog allergens, urine is the source of allergens from rabbits, hamsters, mice and [place] pigs; so ask a non-allergic family member to clean the animal's cage. . If you have a pet allergy, talk to your allergist about the potential for allergy immunotherapy (allergy shots). This strategy can often provide long-term relief.

## 2021-03-06 NOTE — Progress Notes (Signed)
Aeroallergen Immunotherapy   Ordering Provider: Dr. Rexene Alberts   Patient Details  Name: Vanessa Contreras  MRN: [id number]  Date of Birth: 08-31-1996   Order 1 of 2   Vial Label: W-T-Dm   0.5 ml (Volume) 1:20 Concentration -- Weed Mix*  0.5 ml (Volume) 1:20 Concentration -- Eastern 10 Tree Mix (also Sweet Gum)  0.2 ml (Volume) 1:10 Concentration -- Hickory*  0.5 ml (Volume)  AU Concentration -- Mite Mix (DF 5,000 & DP 5,000)    1.7 ml Extract Subtotal  3.3 ml Diluent  5.0 ml Maintenance Total   Schedule: B  Blue Vial (1:100,000): Schedule B (6 doses)  Yellow Vial (1:10,000): Schedule B (6 doses)  Green Vial (1:1,000): Schedule B (6 doses)  Red Vial (1:100): Schedule A (10 doses)   Special Instructions: once per week

## 2021-03-06 NOTE — Progress Notes (Signed)
VIALS MADE & EXP 03-06-22

## 2021-03-07 DIAGNOSIS — J3089 Other allergic rhinitis: Secondary | ICD-10-CM

## 2021-03-08 DIAGNOSIS — J3081 Allergic rhinitis due to animal (cat) (dog) hair and dander: Secondary | ICD-10-CM

## 2021-03-08 NOTE — Telephone Encounter (Signed)
Okay to place urology referral for her for chronic frequent urination

## 2021-03-08 NOTE — Addendum Note (Signed)
Addended by: Kavin Leech on: 03/08/2021 04:51 PM   Modules accepted: Orders

## 2021-03-29 ENCOUNTER — Ambulatory Visit (INDEPENDENT_AMBULATORY_CARE_PROVIDER_SITE_OTHER): Payer: BC Managed Care – PPO | Admitting: *Deleted

## 2021-03-29 ENCOUNTER — Other Ambulatory Visit: Payer: Self-pay

## 2021-03-29 DIAGNOSIS — J309 Allergic rhinitis, unspecified: Secondary | ICD-10-CM | POA: Diagnosis not present

## 2021-03-29 NOTE — Progress Notes (Signed)
Immunotherapy   Patient Details  Name: Vanessa Contreras MRN: [id number] Date of Birth: 1995-11-13  03/29/2021  [name] started injections for  W-T-DM, M-C-D Following schedule: B  Frequency:1 time per week Epi-Pen:Epi-Pen Available  Consent signed and patient instructions given.  Patient started allergy injections today and received 0.69mL of W-T-DM in the [facility] and 0.64mL M-C-D in the [place]. Patient waited 30 minutes and did not experience any issues.    [name] 03/29/2021, 7:17 PM

## 2021-04-04 ENCOUNTER — Other Ambulatory Visit: Payer: Self-pay

## 2021-04-04 MED ORDER — RIZATRIPTAN BENZOATE 5 MG PO TABS: 5.0000 mg | ORAL_TABLET | ORAL | 5 refills | Status: DC | PRN

## 2021-04-05 ENCOUNTER — Ambulatory Visit (INDEPENDENT_AMBULATORY_CARE_PROVIDER_SITE_OTHER): Payer: BC Managed Care – PPO | Admitting: *Deleted

## 2021-04-05 DIAGNOSIS — J309 Allergic rhinitis, unspecified: Secondary | ICD-10-CM

## 2021-04-06 ENCOUNTER — Other Ambulatory Visit: Payer: Self-pay | Admitting: Family Medicine

## 2021-04-12 ENCOUNTER — Ambulatory Visit (INDEPENDENT_AMBULATORY_CARE_PROVIDER_SITE_OTHER): Payer: BC Managed Care – PPO | Admitting: *Deleted

## 2021-04-12 DIAGNOSIS — J309 Allergic rhinitis, unspecified: Secondary | ICD-10-CM

## 2021-04-19 ENCOUNTER — Ambulatory Visit (INDEPENDENT_AMBULATORY_CARE_PROVIDER_SITE_OTHER): Payer: BC Managed Care – PPO | Admitting: *Deleted

## 2021-04-19 DIAGNOSIS — J309 Allergic rhinitis, unspecified: Secondary | ICD-10-CM

## 2021-04-26 ENCOUNTER — Ambulatory Visit (INDEPENDENT_AMBULATORY_CARE_PROVIDER_SITE_OTHER): Payer: BC Managed Care – PPO | Admitting: *Deleted

## 2021-04-26 DIAGNOSIS — J309 Allergic rhinitis, unspecified: Secondary | ICD-10-CM

## 2021-05-17 ENCOUNTER — Ambulatory Visit (INDEPENDENT_AMBULATORY_CARE_PROVIDER_SITE_OTHER): Payer: BC Managed Care – PPO | Admitting: *Deleted

## 2021-05-17 DIAGNOSIS — J309 Allergic rhinitis, unspecified: Secondary | ICD-10-CM | POA: Diagnosis not present

## 2021-05-24 ENCOUNTER — Ambulatory Visit (INDEPENDENT_AMBULATORY_CARE_PROVIDER_SITE_OTHER): Payer: BC Managed Care – PPO | Admitting: *Deleted

## 2021-05-24 DIAGNOSIS — J309 Allergic rhinitis, unspecified: Secondary | ICD-10-CM | POA: Diagnosis not present

## 2021-05-31 ENCOUNTER — Ambulatory Visit (INDEPENDENT_AMBULATORY_CARE_PROVIDER_SITE_OTHER): Payer: BC Managed Care – PPO | Admitting: *Deleted

## 2021-05-31 DIAGNOSIS — J309 Allergic rhinitis, unspecified: Secondary | ICD-10-CM | POA: Diagnosis not present

## 2021-06-07 ENCOUNTER — Ambulatory Visit (INDEPENDENT_AMBULATORY_CARE_PROVIDER_SITE_OTHER): Payer: BC Managed Care – PPO | Admitting: *Deleted

## 2021-06-07 DIAGNOSIS — J309 Allergic rhinitis, unspecified: Secondary | ICD-10-CM | POA: Diagnosis not present

## 2021-06-14 ENCOUNTER — Ambulatory Visit (INDEPENDENT_AMBULATORY_CARE_PROVIDER_SITE_OTHER): Payer: BC Managed Care – PPO

## 2021-06-14 DIAGNOSIS — J309 Allergic rhinitis, unspecified: Secondary | ICD-10-CM

## 2021-06-21 ENCOUNTER — Ambulatory Visit (INDEPENDENT_AMBULATORY_CARE_PROVIDER_SITE_OTHER): Payer: BC Managed Care – PPO

## 2021-06-21 DIAGNOSIS — J309 Allergic rhinitis, unspecified: Secondary | ICD-10-CM | POA: Diagnosis not present

## 2021-06-22 ENCOUNTER — Other Ambulatory Visit: Payer: Self-pay | Admitting: Family Medicine

## 2021-06-28 ENCOUNTER — Ambulatory Visit (INDEPENDENT_AMBULATORY_CARE_PROVIDER_SITE_OTHER): Payer: BC Managed Care – PPO | Admitting: *Deleted

## 2021-06-28 DIAGNOSIS — J309 Allergic rhinitis, unspecified: Secondary | ICD-10-CM | POA: Diagnosis not present

## 2021-07-05 ENCOUNTER — Ambulatory Visit (INDEPENDENT_AMBULATORY_CARE_PROVIDER_SITE_OTHER): Payer: BC Managed Care – PPO

## 2021-07-05 DIAGNOSIS — J309 Allergic rhinitis, unspecified: Secondary | ICD-10-CM | POA: Diagnosis not present

## 2021-07-10 ENCOUNTER — Ambulatory Visit: Payer: BC Managed Care – PPO | Admitting: Allergy

## 2021-07-12 ENCOUNTER — Ambulatory Visit (INDEPENDENT_AMBULATORY_CARE_PROVIDER_SITE_OTHER): Payer: BC Managed Care – PPO | Admitting: *Deleted

## 2021-07-12 DIAGNOSIS — J309 Allergic rhinitis, unspecified: Secondary | ICD-10-CM | POA: Diagnosis not present

## 2021-07-19 ENCOUNTER — Ambulatory Visit (INDEPENDENT_AMBULATORY_CARE_PROVIDER_SITE_OTHER): Payer: BC Managed Care – PPO | Admitting: *Deleted

## 2021-07-19 DIAGNOSIS — J309 Allergic rhinitis, unspecified: Secondary | ICD-10-CM | POA: Diagnosis not present

## 2021-07-24 ENCOUNTER — Encounter: Payer: Self-pay | Admitting: Allergy

## 2021-07-24 ENCOUNTER — Other Ambulatory Visit: Payer: Self-pay

## 2021-07-24 ENCOUNTER — Ambulatory Visit: Payer: BC Managed Care – PPO | Admitting: Allergy

## 2021-07-24 VITALS — BP 116/72 | HR 80 | Temp 97.3°F | Resp 18 | Ht 67.13 in | Wt 134.5 lb

## 2021-07-24 DIAGNOSIS — J4520 Mild intermittent asthma, uncomplicated: Secondary | ICD-10-CM

## 2021-07-24 DIAGNOSIS — H1010 Acute atopic conjunctivitis, unspecified eye: Secondary | ICD-10-CM | POA: Diagnosis not present

## 2021-07-24 DIAGNOSIS — J3089 Other allergic rhinitis: Secondary | ICD-10-CM

## 2021-07-24 DIAGNOSIS — J302 Other seasonal allergic rhinitis: Secondary | ICD-10-CM

## 2021-07-24 MED ORDER — MONTELUKAST SODIUM 10 MG PO TABS: 10.0000 mg | ORAL_TABLET | Freq: Every day | ORAL | 5 refills | Status: AC

## 2021-07-24 NOTE — Assessment & Plan Note (Signed)
Past history - Diagnosed with asthma 5 years ago.  Main triggers are allergies and exercise.  Used to be on Breo but now using albuterol less than once per month with good benefit. 2022 spirometry was unremarkable given effort. Interim history - using albuterol once every few months.   Continue Singulair (montelukast) 10mg  daily at night.  May use albuterol rescue inhaler 2 puffs every 4 to 6 hours as needed for shortness of breath, chest tightness, coughing, and wheezing. May use albuterol rescue inhaler 2 puffs 5 to 15 minutes prior to strenuous physical activities. Monitor frequency of use.

## 2021-07-24 NOTE — Assessment & Plan Note (Signed)
Past history - Perennial rhinoconjunctivitis symptoms for 20+ years with worsening in the spring and summer.  Patient was on allergy immunotherapy 2 separate times for 5 years each with good benefit.  Currently on Zyrtec and Singulair with good benefit. 2 dogs, 1 rabbit, 4 birds, 1 [place] pig at home. 2022 skin testing showed: Positive to trees, weed pollen, mold, dust mites, cat, dog, rabbit, [place] pig. Interim history - started AIT on 03/29/2021 (W-T-DM, M-C-D) but no improvement in symptoms yet.   Continue environmental control measures as below.  Continue allergy injections.   Will check if we can add on rabbit and [place] pig to AIT regimen. Typically we don't carry these extracts.   Use over the counter antihistamines such as Zyrtec (cetirizine), Claritin (loratadine), Allegra (fexofenadine), or Xyzal (levocetirizine) daily as needed. May take twice a day during allergy flares. May switch antihistamines every few months.  Start taking Singulair (montelukast) 10mg  daily at night.

## 2021-07-24 NOTE — Patient Instructions (Addendum)
Environmental allergies 2022 skin testing: Positive to trees, weed pollen, mold, dust mites, cat, dog, rabbit, [place] pig. Will check if we can add on rabbit and [place] pig injections.  Continue environmental control measures as below. Continue allergy injections.  Use over the counter antihistamines such as Zyrtec (cetirizine), Claritin (loratadine), Allegra (fexofenadine), or Xyzal (levocetirizine) daily as needed. May take twice a day during allergy flares. May switch antihistamines every few months. Start taking Singulair (montelukast) 10mg  daily at night.  Asthma: Continue Singulair (montelukast) 10mg  daily at night. May use albuterol rescue inhaler 2 puffs every 4 to 6 hours as needed for shortness of breath, chest tightness, coughing, and wheezing. May use albuterol rescue inhaler 2 puffs 5 to 15 minutes prior to strenuous physical activities. Monitor frequency of use.   Follow up in 6 months or sooner if needed.   Reducing Pollen Exposure Pollen seasons: trees (spring), grass (summer) and ragweed/weeds (fall). Keep windows closed in your home and car to lower pollen exposure.  Install air conditioning in the bedroom and throughout the house if possible.  Avoid going out in dry windy days - especially early morning. Pollen counts are highest between 5 - 10 AM and on dry, hot and windy days.  Save outside activities for late afternoon or after a heavy rain, when pollen levels are lower.  Avoid mowing of grass if you have grass pollen allergy. Be aware that pollen can also be transported indoors on people and pets.  Dry your clothes in an automatic dryer rather than hanging them outside where they might collect pollen.  Rinse hair and eyes before bedtime. Mold Control Mold and fungi can grow on a variety of surfaces provided certain temperature and moisture conditions exist.  Outdoor molds grow on plants, decaying vegetation and soil. The major outdoor mold, Alternaria and  Cladosporium, are found in very high numbers during hot and dry conditions. Generally, a late summer - fall peak is seen for common outdoor fungal spores. Rain will temporarily lower outdoor mold spore count, but counts rise rapidly when the rainy period ends. The most important indoor molds are Aspergillus and Penicillium. Dark, humid and poorly ventilated basements are ideal sites for mold growth. The next most common sites of mold growth are the bathroom and the kitchen. Outdoor (Seasonal) Mold Control Use air conditioning and keep windows closed. Avoid exposure to decaying vegetation. Avoid leaf raking. Avoid grain handling. Consider wearing a face mask if working in moldy areas.  Indoor (Perennial) Mold Control  Maintain humidity below 50%. Get rid of mold growth on hard surfaces with water, detergent and, if necessary, 5% bleach (do not mix with other cleaners). Then dry the area completely. If mold covers an area more than 10 square feet, consider hiring an indoor environmental professional. For clothing, washing with soap and water is best. If moldy items cannot be cleaned and dried, throw them away. Remove sources e.g. contaminated carpets. Repair and seal leaking roofs or pipes. Using dehumidifiers in damp basements may be helpful, but empty the water and clean units regularly to prevent mildew from forming. All rooms, especially basements, bathrooms and kitchens, require ventilation and cleaning to deter mold and mildew growth. Avoid carpeting on concrete or damp floors, and storing items in damp areas. Control of House Dust Mite Allergen Dust mite allergens are a common trigger of allergy and asthma symptoms. While they can be found throughout the house, these microscopic creatures thrive in warm, humid environments such as bedding, upholstered furniture and  carpeting. Because so much time is spent in the bedroom, it is essential to reduce mite levels there.  Encase pillows, mattresses,  and box springs in special allergen-proof fabric covers or airtight, zippered plastic covers.  Bedding should be washed weekly in hot water (130 F) and dried in a hot dryer. Allergen-proof covers are available for comforters and pillows that can't be regularly washed.  Wash the allergy-proof covers every few months. Minimize clutter in the bedroom. Keep pets out of the bedroom.  Keep humidity less than 50% by using a dehumidifier or air conditioning. You can buy a humidity measuring device called a hygrometer to monitor this.  If possible, replace carpets with hardwood, linoleum, or washable area rugs. If that's not possible, vacuum frequently with a vacuum that has a HEPA filter. Remove all upholstered furniture and non-washable window drapes from the bedroom. Remove all non-washable stuffed toys from the bedroom.  Wash stuffed toys weekly. Pet Allergen Avoidance: Contrary to popular opinion, there are no "hypoallergenic" breeds of dogs or cats. That is because people are not allergic to an animal's hair, but to an allergen found in the animal's saliva, dander (dead skin flakes) or urine. Pet allergy symptoms typically occur within minutes. For some people, symptoms can build up and become most severe 8 to 12 hours after contact with the animal. People with severe allergies can experience reactions in public places if dander has been transported on the pet owners' clothing. Keeping an animal outdoors is only a partial solution, since homes with pets in the yard still have higher concentrations of animal allergens. Before getting a pet, ask your allergist to determine if you are allergic to animals. If your pet is already considered part of your family, try to minimize contact and keep the pet out of the bedroom and other rooms where you spend a great deal of time. As with dust mites, vacuum carpets often or replace carpet with a hardwood floor, tile or linoleum. High-efficiency particulate air (HEPA)  cleaners can reduce allergen levels over time. While dander and saliva are the source of cat and dog allergens, urine is the source of allergens from rabbits, hamsters, mice and [place] pigs; so ask a non-allergic family member to clean the animal's cage. If you have a pet allergy, talk to your allergist about the potential for allergy immunotherapy (allergy shots). This strategy can often provide long-term relief.

## 2021-07-24 NOTE — Progress Notes (Signed)
Follow Up Note  RE: Vanessa Contreras MRN: [id number] DOB: [birth year] Date of Office Visit: 07/24/2021  Referring provider: Ma Hillock, DO Primary care provider: Ma Hillock, DO  Chief Complaint: Allergies (Hasn't seen any improvements from injections yet. Still taking allergy medication)  History of Present Illness: I had the pleasure of seeing Vanessa Contreras for a follow up visit at the [facility] on 07/24/2021. She is a 25 y.o. female, who is being followed for allergic rhinoconjunctivitis on AIT and asthma. Her previous allergy office visit was on 03/06/2021 with Dr. Maudie Mercury. Today is a regular follow up visit.  Allergic rhino conjunctivitis Started allergy injections in June with no issues. Currently taking zyrtec 10mg  daily in the morning and Singulair 10mg  at night a few times per month. Does not use any eye drops or nasal sprays.  Usually has symptoms in the mornings.  Asthma  Using albuterol once every few months as needed.  Denies any ER/urgent care visits or prednisone use since the last visit.  Assessment and Plan: [name] is a 25 y.o. female with: Seasonal and perennial allergic rhinoconjunctivitis Past history - Perennial rhinoconjunctivitis symptoms for 20+ years with worsening in the spring and summer.  Patient was on allergy immunotherapy 2 separate times for 5 years each with good benefit.  Currently on Zyrtec and Singulair with good benefit. 2 dogs, 1 rabbit, 4 birds, 1 [place] pig at home. 2022 skin testing showed: Positive to trees, weed pollen, mold, dust mites, cat, dog, rabbit, [place] pig. Interim history - started AIT on 03/29/2021 (W-T-DM, M-C-D) but no improvement in symptoms yet.  Continue environmental control measures as below. Continue allergy injections.  Will check if we can add on rabbit and [place] pig to AIT regimen. Typically we don't carry these extracts.  Use over the counter antihistamines such as Zyrtec (cetirizine), Claritin  (loratadine), Allegra (fexofenadine), or Xyzal (levocetirizine) daily as needed. May take twice a day during allergy flares. May switch antihistamines every few months. Start taking Singulair (montelukast) 10mg  daily at night.  Mild intermittent asthma without complication Past history - Diagnosed with asthma 5 years ago.  Main triggers are allergies and exercise.  Used to be on Breo but now using albuterol less than once per month with good benefit. 2022 spirometry was unremarkable given effort. Interim history - using albuterol once every few months.  Continue Singulair (montelukast) 10mg  daily at night. May use albuterol rescue inhaler 2 puffs every 4 to 6 hours as needed for shortness of breath, chest tightness, coughing, and wheezing. May use albuterol rescue inhaler 2 puffs 5 to 15 minutes prior to strenuous physical activities. Monitor frequency of use.   Return in about 6 months (around 01/22/2022).  Meds ordered this encounter  Medications   montelukast (SINGULAIR) 10 MG tablet    Sig: Take 1 tablet (10 mg total) by mouth at bedtime.    Dispense:  30 tablet    Refill:  5    Lab Orders  No laboratory test(s) ordered today    Diagnostics: None.  Medication List:  Current Outpatient Medications  Medication Sig Dispense Refill   albuterol (PROVENTIL HFA;VENTOLIN HFA) 108 (90 Base) MCG/ACT inhaler Inhale 2 puffs into the lungs every 6 (six) hours as needed for wheezing. Reported on 04/05/2016 1 Inhaler 5   EPINEPHrine (AUVI-Q) 0.3 mg/0.3 mL IJ SOAJ injection Inject 0.3 mg into the muscle as needed for anaphylaxis. 1 each 1   hydrOXYzine (ATARAX/VISTARIL) 10 MG tablet TAKE  1-3 TABLETS (10-30 MG TOTAL) BY MOUTH DAILY AS NEEDED. 270 tablet 1   montelukast (SINGULAIR) 10 MG tablet Take 1 tablet (10 mg total) by mouth at bedtime. 30 tablet 5   NIKKI 3-0.02 MG tablet Take 1 tablet by mouth daily. 28 tablet 11   rizatriptan (MAXALT) 5 MG tablet Take 1 tablet (5 mg total) by mouth as  needed for migraine. May repeat in 2 hours if needed 10 tablet 5   Vibegron (GEMTESA) 75 MG TABS      No current facility-administered medications for this visit.   Allergies: No Known Allergies I reviewed her past medical history, social history, family history, and environmental history and no significant changes have been reported from her previous visit.  Review of Systems  Constitutional:  Negative for appetite change, chills, fever and unexpected weight change.  HENT:  Positive for congestion. Negative for rhinorrhea and sneezing.   Eyes:  Negative for itching.  Respiratory:  Negative for cough, chest tightness, shortness of breath and wheezing.   Cardiovascular:  Negative for chest pain.  Gastrointestinal:  Negative for abdominal pain.  Genitourinary:  Negative for difficulty urinating.  Skin:  Negative for rash.  Allergic/Immunologic: Positive for environmental allergies.   Objective: BP 116/72 (BP Location: Left Arm, Patient Position: Sitting, Cuff Size: Normal)   Pulse 80   Temp (!) 97.3 F (36.3 C) (Temporal)   Resp 18   Ht 5' 7.13" (1.705 m)   Wt 134 lb 8 oz (61 kg)   SpO2 100%   BMI 20.99 kg/m  Body mass index is 20.99 kg/m. Physical Exam Vitals and nursing note reviewed.  Constitutional:      Appearance: Normal appearance. She is well-developed.  HENT:     Head: Normocephalic and atraumatic.     Right Ear: Tympanic membrane and external ear normal.     Left Ear: Tympanic membrane and external ear normal.     Nose: Nose normal.     Mouth/Throat:     Mouth: Mucous membranes are moist.     Pharynx: Oropharynx is clear.  Eyes:     Conjunctiva/sclera: Conjunctivae normal.  Cardiovascular:     Rate and Rhythm: Normal rate and regular rhythm.     Heart sounds: Normal heart sounds. No murmur heard.   No friction rub. No gallop.  Pulmonary:     Effort: Pulmonary effort is normal.     Breath sounds: Normal breath sounds. No wheezing, rhonchi or rales.   Musculoskeletal:     Cervical back: Neck supple.  Skin:    General: Skin is warm.     Findings: No rash.  Neurological:     Mental Status: She is alert and oriented to person, place, and time.  Psychiatric:        Behavior: Behavior normal.   Previous notes and tests were reviewed. The plan was reviewed with the patient/family, and all questions/concerned were addressed.  It was my pleasure to see Cheryn today and participate in her care. Please feel free to contact me with any questions or concerns.  Sincerely,  Rexene Alberts, DO Allergy & Immunology  [facility] [place] office: [place] office: [phone]

## 2021-07-26 ENCOUNTER — Ambulatory Visit (INDEPENDENT_AMBULATORY_CARE_PROVIDER_SITE_OTHER): Payer: BC Managed Care – PPO | Admitting: *Deleted

## 2021-07-26 DIAGNOSIS — J309 Allergic rhinitis, unspecified: Secondary | ICD-10-CM | POA: Diagnosis not present

## 2021-07-31 ENCOUNTER — Ambulatory Visit (INDEPENDENT_AMBULATORY_CARE_PROVIDER_SITE_OTHER): Payer: BC Managed Care – PPO | Admitting: *Deleted

## 2021-07-31 DIAGNOSIS — J309 Allergic rhinitis, unspecified: Secondary | ICD-10-CM | POA: Diagnosis not present

## 2021-08-09 ENCOUNTER — Ambulatory Visit (INDEPENDENT_AMBULATORY_CARE_PROVIDER_SITE_OTHER): Payer: BC Managed Care – PPO | Admitting: *Deleted

## 2021-08-09 DIAGNOSIS — J309 Allergic rhinitis, unspecified: Secondary | ICD-10-CM | POA: Diagnosis not present

## 2021-08-16 ENCOUNTER — Ambulatory Visit (INDEPENDENT_AMBULATORY_CARE_PROVIDER_SITE_OTHER): Payer: BC Managed Care – PPO

## 2021-08-16 DIAGNOSIS — J309 Allergic rhinitis, unspecified: Secondary | ICD-10-CM

## 2021-08-23 ENCOUNTER — Ambulatory Visit (INDEPENDENT_AMBULATORY_CARE_PROVIDER_SITE_OTHER): Payer: BC Managed Care – PPO

## 2021-08-23 DIAGNOSIS — J309 Allergic rhinitis, unspecified: Secondary | ICD-10-CM | POA: Diagnosis not present

## 2021-08-29 ENCOUNTER — Ambulatory Visit (INDEPENDENT_AMBULATORY_CARE_PROVIDER_SITE_OTHER): Payer: BC Managed Care – PPO

## 2021-08-29 DIAGNOSIS — J309 Allergic rhinitis, unspecified: Secondary | ICD-10-CM

## 2021-09-06 ENCOUNTER — Ambulatory Visit (INDEPENDENT_AMBULATORY_CARE_PROVIDER_SITE_OTHER): Payer: BC Managed Care – PPO | Admitting: *Deleted

## 2021-09-06 DIAGNOSIS — J309 Allergic rhinitis, unspecified: Secondary | ICD-10-CM | POA: Diagnosis not present

## 2021-09-10 ENCOUNTER — Other Ambulatory Visit: Payer: Self-pay | Admitting: Family Medicine

## 2021-09-13 ENCOUNTER — Ambulatory Visit (INDEPENDENT_AMBULATORY_CARE_PROVIDER_SITE_OTHER): Payer: BC Managed Care – PPO

## 2021-09-13 DIAGNOSIS — J309 Allergic rhinitis, unspecified: Secondary | ICD-10-CM | POA: Diagnosis not present

## 2021-09-20 ENCOUNTER — Ambulatory Visit (INDEPENDENT_AMBULATORY_CARE_PROVIDER_SITE_OTHER): Payer: BC Managed Care – PPO

## 2021-09-20 DIAGNOSIS — J309 Allergic rhinitis, unspecified: Secondary | ICD-10-CM | POA: Diagnosis not present

## 2021-09-24 ENCOUNTER — Other Ambulatory Visit: Payer: Self-pay | Admitting: Family Medicine

## 2021-09-25 ENCOUNTER — Ambulatory Visit (INDEPENDENT_AMBULATORY_CARE_PROVIDER_SITE_OTHER): Payer: BC Managed Care – PPO | Admitting: *Deleted

## 2021-09-25 DIAGNOSIS — J309 Allergic rhinitis, unspecified: Secondary | ICD-10-CM | POA: Diagnosis not present

## 2021-10-04 ENCOUNTER — Ambulatory Visit (INDEPENDENT_AMBULATORY_CARE_PROVIDER_SITE_OTHER): Payer: BC Managed Care – PPO | Admitting: *Deleted

## 2021-10-04 DIAGNOSIS — J309 Allergic rhinitis, unspecified: Secondary | ICD-10-CM | POA: Diagnosis not present

## 2021-10-11 ENCOUNTER — Ambulatory Visit (INDEPENDENT_AMBULATORY_CARE_PROVIDER_SITE_OTHER): Payer: BC Managed Care – PPO

## 2021-10-11 DIAGNOSIS — J309 Allergic rhinitis, unspecified: Secondary | ICD-10-CM | POA: Diagnosis not present

## 2021-10-15 DIAGNOSIS — J3089 Other allergic rhinitis: Secondary | ICD-10-CM | POA: Diagnosis not present

## 2021-10-15 NOTE — Progress Notes (Signed)
VIALS MADE. EXP 10-15-22 °

## 2021-10-18 ENCOUNTER — Ambulatory Visit (INDEPENDENT_AMBULATORY_CARE_PROVIDER_SITE_OTHER): Payer: BC Managed Care – PPO

## 2021-10-18 ENCOUNTER — Other Ambulatory Visit: Payer: Self-pay | Admitting: Family Medicine

## 2021-10-18 DIAGNOSIS — J309 Allergic rhinitis, unspecified: Secondary | ICD-10-CM | POA: Diagnosis not present

## 2021-10-25 ENCOUNTER — Ambulatory Visit (INDEPENDENT_AMBULATORY_CARE_PROVIDER_SITE_OTHER): Payer: BC Managed Care – PPO

## 2021-10-25 DIAGNOSIS — J309 Allergic rhinitis, unspecified: Secondary | ICD-10-CM

## 2021-11-01 ENCOUNTER — Ambulatory Visit (INDEPENDENT_AMBULATORY_CARE_PROVIDER_SITE_OTHER): Payer: BC Managed Care – PPO

## 2021-11-01 DIAGNOSIS — J309 Allergic rhinitis, unspecified: Secondary | ICD-10-CM | POA: Diagnosis not present

## 2021-11-08 ENCOUNTER — Ambulatory Visit (INDEPENDENT_AMBULATORY_CARE_PROVIDER_SITE_OTHER): Payer: BC Managed Care – PPO

## 2021-11-08 DIAGNOSIS — J309 Allergic rhinitis, unspecified: Secondary | ICD-10-CM

## 2021-11-15 ENCOUNTER — Ambulatory Visit (INDEPENDENT_AMBULATORY_CARE_PROVIDER_SITE_OTHER): Payer: BC Managed Care – PPO

## 2021-11-15 DIAGNOSIS — J309 Allergic rhinitis, unspecified: Secondary | ICD-10-CM

## 2021-11-18 ENCOUNTER — Other Ambulatory Visit: Payer: Self-pay | Admitting: Family Medicine

## 2021-11-22 ENCOUNTER — Ambulatory Visit (INDEPENDENT_AMBULATORY_CARE_PROVIDER_SITE_OTHER): Payer: BC Managed Care – PPO

## 2021-11-22 DIAGNOSIS — J309 Allergic rhinitis, unspecified: Secondary | ICD-10-CM | POA: Diagnosis not present

## 2021-11-29 ENCOUNTER — Ambulatory Visit (INDEPENDENT_AMBULATORY_CARE_PROVIDER_SITE_OTHER): Payer: BC Managed Care – PPO | Admitting: *Deleted

## 2021-11-29 DIAGNOSIS — J309 Allergic rhinitis, unspecified: Secondary | ICD-10-CM | POA: Diagnosis not present

## 2021-12-06 ENCOUNTER — Ambulatory Visit (INDEPENDENT_AMBULATORY_CARE_PROVIDER_SITE_OTHER): Payer: BC Managed Care – PPO

## 2021-12-06 DIAGNOSIS — J309 Allergic rhinitis, unspecified: Secondary | ICD-10-CM | POA: Diagnosis not present

## 2021-12-11 ENCOUNTER — Encounter: Payer: Self-pay | Admitting: Family Medicine

## 2021-12-11 ENCOUNTER — Ambulatory Visit (INDEPENDENT_AMBULATORY_CARE_PROVIDER_SITE_OTHER): Payer: BC Managed Care – PPO | Admitting: Family Medicine

## 2021-12-11 ENCOUNTER — Other Ambulatory Visit: Payer: Self-pay

## 2021-12-11 VITALS — BP 102/62 | HR 65 | Temp 98.2°F | Ht 66.0 in | Wt 133.0 lb

## 2021-12-11 DIAGNOSIS — G43409 Hemiplegic migraine, not intractable, without status migrainosus: Secondary | ICD-10-CM | POA: Diagnosis not present

## 2021-12-11 DIAGNOSIS — Z0000 Encounter for general adult medical examination without abnormal findings: Secondary | ICD-10-CM

## 2021-12-11 DIAGNOSIS — Z793 Long term (current) use of hormonal contraceptives: Secondary | ICD-10-CM | POA: Diagnosis not present

## 2021-12-11 DIAGNOSIS — H1010 Acute atopic conjunctivitis, unspecified eye: Secondary | ICD-10-CM

## 2021-12-11 DIAGNOSIS — J3089 Other allergic rhinitis: Secondary | ICD-10-CM

## 2021-12-11 DIAGNOSIS — J302 Other seasonal allergic rhinitis: Secondary | ICD-10-CM

## 2021-12-11 DIAGNOSIS — J4540 Moderate persistent asthma, uncomplicated: Secondary | ICD-10-CM

## 2021-12-11 LAB — CBC
HCT: 41.3 % (ref 36.0–46.0)
Hemoglobin: 13.8 g/dL (ref 12.0–15.0)
MCHC: 33.5 g/dL (ref 30.0–36.0)
MCV: 90.6 fl (ref 78.0–100.0)
Platelets: 309 10*3/uL (ref 150.0–400.0)
RBC: 4.56 Mil/uL (ref 3.87–5.11)
RDW: 14.1 % (ref 11.5–15.5)
WBC: 4.7 10*3/uL (ref 4.0–10.5)

## 2021-12-11 LAB — COMPREHENSIVE METABOLIC PANEL WITH GFR
ALT: 13 U/L (ref 0–35)
AST: 19 U/L (ref 0–37)
Albumin: 4.3 g/dL (ref 3.5–5.2)
Alkaline Phosphatase: 65 U/L (ref 39–117)
BUN: 14 mg/dL (ref 6–23)
CO2: 27 meq/L (ref 19–32)
Calcium: 9.4 mg/dL (ref 8.4–10.5)
Chloride: 105 meq/L (ref 96–112)
Creatinine, Ser: 0.73 mg/dL (ref 0.40–1.20)
GFR: 115.84 mL/min
Glucose, Bld: 87 mg/dL (ref 70–99)
Potassium: 4.5 meq/L (ref 3.5–5.1)
Sodium: 138 meq/L (ref 135–145)
Total Bilirubin: 0.6 mg/dL (ref 0.2–1.2)
Total Protein: 7 g/dL (ref 6.0–8.3)

## 2021-12-11 LAB — TSH: TSH: 2.48 u[IU]/mL (ref 0.35–5.50)

## 2021-12-11 MED ORDER — HYDROXYZINE HCL 10 MG PO TABS: 10.0000 mg | ORAL_TABLET | Freq: Every day | ORAL | 1 refills | Status: DC | PRN

## 2021-12-11 MED ORDER — ALBUTEROL SULFATE HFA 108 (90 BASE) MCG/ACT IN AERS
2.0000 | INHALATION_SPRAY | Freq: Four times a day (QID) | RESPIRATORY_TRACT | 11 refills | Status: DC | PRN
Start: 2021-12-11 — End: 2022-12-10

## 2021-12-11 MED ORDER — RIZATRIPTAN BENZOATE 5 MG PO TABS: 5.0000 mg | ORAL_TABLET | ORAL | 11 refills | Status: DC | PRN

## 2021-12-11 NOTE — Progress Notes (Signed)
This visit occurred during the SARS-CoV-2 public health emergency.  Safety protocols were in place, including screening questions prior to the visit, additional usage of staff PPE, and extensive cleaning of exam room while observing appropriate contact time as indicated for disinfecting solutions.    Patient ID: Vanessa Contreras, female  DOB: 1996/05/19, 26 y.o.   MRN: [id number] Patient Care Team    Relationship Specialty Notifications Start End  Ma Hillock, DO PCP - General Family Medicine  10/09/20   Garnet Sierras, DO Consulting Physician Allergy  12/11/21   Marshell Garfinkel, MD Consulting Physician Pulmonary Disease  12/11/21   Trula Slade, DPM Consulting Physician Podiatry  12/11/21   Melida Quitter, MD Consulting Physician Otolaryngology  12/11/21     Chief Complaint  Patient presents with   Annual Exam    Pt is fasting    Subjective: Vanessa Contreras is a 26 y.o.  Female  present for CPE /cmc All past medical history, surgical history, allergies, family history, immunizations, medications and social history were updated in the electronic medical record today. All recent labs, ED visits and hospitalizations within the last year were reviewed.  Health maintenance:  Colonoscopy: no fhx. Start screen at 4 Mammogram: no fhx. Routine screen at 42 Cervical cancer screening: making an appt to establish at gyn> never SA Immunizations: tdap - UTD 2022, Influenza UTD 2022(encouraged yearly), covid series completed x3. HPV series completed.  Infectious disease screening: HIV never SA. and  Hep C > collected today Patient has a Dental home. Hospitalizations/ED visits: reviewed  Chronic frequent urination: Since last visit when she complained of  chronic frequent urination for "as long as she can remember, she has established with urology and started [place]  She had reported> She will feel the urge to use the bathroom about every 30-60 min. She drinks about 60 ounces of water a day. She  endorses only producing a small amount of urine each episode. She denies any infectious symptoms and she had never been Sexually active.    Migraine: not in awhile.  She states she has not had a migraine in awhile. She use to have an aura with migraines, but that had subsided.reports they have changed. The headaches respond in about an hour to maxalt, but sometimes return the next day and she repeats the maxalt. She has continued  the b12 and magnesium supplements.  Prior note: Pt reports she started having migraines when she was about 14 or 26 yrs old. She had been elevated by neurology at that time (Dr. Jannifer Franklin and ped neuro) for possible causes with normal EEG and work up at that time. She reports since onset her migraines have been associated with unilateral paraesthesia, weakness and Aura. Symptoms will affect her face, arm and sometimes leg. Initially, her headaches started on the left side of her head (and body) and now present on either right or left side. She states she use to only have a migraine 1-2x a year. Over the last 6 months she has had an increase in her headaches to 1-2 a month. She denies any medication changes over this time. She is prescribed BCP by prior PCP. She has been on the same BCP for over 2 yrs. Her mother also has a h/o chronic  migraines. She reports she has never been tried on any medications. She usually will take an NSAID and lay down.     Depression screen [facility] 2/9 12/11/2021 12/06/2020 10/09/2020  Decreased Interest 0 0  0  Down, Depressed, Hopeless 0 0 0  PHQ - 2 Score 0 0 0   No flowsheet data found.   Immunization History  Administered Date(s) Administered   DTaP 08/04/1998, 10/03/1998, 11/27/1998, 09/04/1999, 05/10/2002   Hepatitis A 07/11/2005, 08/04/2006   Hepatitis B 11/27/1998, 02/26/1999, 09/04/1999   HiB (PRP-OMP) 08/04/1998, 10/03/1998, 11/27/1998, 09/04/1999   Hpv-Unspecified 10/05/2012, 12/15/2012, 04/26/2013   IPV 08/04/1998, 10/03/1998, 07/03/1999,  05/10/2002, 05/10/2003   Influenza Split 06/02/2017   Influenza, Seasonal, Injecte, Preservative Fre 05/21/2014   Influenza,inj,Quad PF,6+ Mos 05/23/2015, 08/06/2021   Influenza-Unspecified 08/07/2020   MMR 07/03/1999, 05/10/2002   MenQuadfi_Meningococcal Groups ACYW Conjugate 02/01/2015   Meningococcal Conjugate 11/30/2009   PFIZER(Purple Top)SARS-COV-2 Vaccination 12/11/2019, 01/01/2020, 09/22/2020   Tdap 11/30/2009, 12/06/2020   Varicella 06/08/1999, 08/16/2011     Past Medical History:  Diagnosis Date   Allergy    Asthma    exercise induced   Epidermal cyst of neck 03/25/2013   Migraines    Numbness on left side 05/11/2016   No Known Allergies Past Surgical History:  Procedure Laterality Date   EAR CYST EXCISION Left 03/25/2013   Procedure: EXCISION OF A LEFT POSTERIOR AURICULAR CYST;  Surgeon: Theodoro Kos, DO;  Location: [place];  Service: Plastics;  Laterality: Left;   HAND SURGERY     giant cell tumor removed lt hand   Family History  Problem Relation Age of Onset   Migraines Mother    68 / [place] Mother    Allergic rhinitis Mother    Allergic rhinitis Sister    Allergic rhinitis Brother    Depression Maternal Grandmother    Anxiety disorder Maternal Grandmother    Allergic rhinitis Sister    Allergic rhinitis Sister    Testicular cancer Father    Social History   Social History Narrative   Marital status/children/pets: Single   Education/employment: grad Administrator:      -Wears a bicycle helmet riding a bike: Yes     -smoke alarm in the home:Yes     - wears seatbelt: Yes     - Feels safe in their relationships: Yes    Allergies as of 12/11/2021   No Known Allergies      Medication List        Accurate as of December 11, 2021  8:50 AM. If you have any questions, ask your nurse or doctor.          STOP taking these medications    Gemtesa 75 MG Tabs Generic drug: Vibegron Stopped by: Howard Pouch, DO    Nikki 3-0.02 MG tablet Generic drug: drospirenone-ethinyl estradiol Stopped by: Howard Pouch, DO       TAKE these medications    albuterol 108 (90 Base) MCG/ACT inhaler Commonly known as: VENTOLIN HFA Inhale 2 puffs into the lungs every 6 (six) hours as needed for wheezing. What changed: additional instructions Changed by: Howard Pouch, DO   EPINEPHrine 0.3 mg/0.3 mL Soaj injection Commonly known as: Auvi-Q Inject 0.3 mg into the muscle as needed for anaphylaxis.   hydrOXYzine 10 MG tablet Commonly known as: ATARAX Take 1-3 tablets (10-30 mg total) by mouth daily as needed.   montelukast 10 MG tablet Commonly known as: Singulair Take 1 tablet (10 mg total) by mouth at bedtime.   rizatriptan 5 MG tablet Commonly known as: MAXALT Take 1 tablet (5 mg total) by mouth as needed for migraine. May repeat in 2 hours if needed What changed: See the new instructions.  Changed by: Howard Pouch, DO        All past medical history, surgical history, allergies, family history, immunizations andmedications were updated in the EMR today and reviewed under the history and medication portions of their EMR.     No results found for this or any previous visit (from the past 2160 hour(s)).  DG Chest 2 View  Result Date: 08/16/2017 CLINICAL DATA:  Shortness of breath with chest pain EXAM: CHEST  2 VIEW COMPARISON:  October 05, 2012 FINDINGS: There is no edema or consolidation. Heart size and pulmonary vascularity are normal. No adenopathy. No pneumothorax. No bone lesions. IMPRESSION: No edema or consolidation. Electronically Signed   By: Lowella Grip III M.D.   On: 08/16/2017 16:04     ROS 14 pt review of systems performed and negative (unless mentioned in an HPI)  Objective:  BP 102/62    Pulse 65    Temp 98.2 F (36.8 C) (Oral)    Ht $R'5\' 6"'kz$  (1.676 m)    Wt 133 lb (60.3 kg)    SpO2 99%    BMI 21.47 kg/m  Physical Exam Vitals and nursing note reviewed.  Constitutional:       General: She is not in acute distress.    Appearance: Normal appearance. She is not ill-appearing or toxic-appearing.  HENT:     Head: Normocephalic and atraumatic.     Right Ear: Tympanic membrane, ear canal and external ear normal. There is no impacted cerumen.     Left Ear: Tympanic membrane, ear canal and external ear normal. There is no impacted cerumen.     Nose: No congestion or rhinorrhea.     Mouth/Throat:     Mouth: Mucous membranes are moist.     Pharynx: Oropharynx is clear. No oropharyngeal exudate or posterior oropharyngeal erythema.  Eyes:     General: No scleral icterus.       Right eye: No discharge.        Left eye: No discharge.     Extraocular Movements: Extraocular movements intact.     Conjunctiva/sclera: Conjunctivae normal.     Pupils: Pupils are equal, round, and reactive to light.  Cardiovascular:     Rate and Rhythm: Normal rate and regular rhythm.     Pulses: Normal pulses.     Heart sounds: Normal heart sounds. No murmur heard.   No friction rub. No gallop.  Pulmonary:     Effort: Pulmonary effort is normal. No respiratory distress.     Breath sounds: Normal breath sounds. No stridor. No wheezing, rhonchi or rales.  Chest:     Chest wall: No tenderness.  Abdominal:     General: Abdomen is flat. Bowel sounds are normal. There is no distension.     Palpations: Abdomen is soft. There is no mass.     Tenderness: There is no abdominal tenderness. There is no right CVA tenderness, left CVA tenderness, guarding or rebound.     Hernia: No hernia is present.  Musculoskeletal:        General: No swelling, tenderness or deformity. Normal range of motion.     Cervical back: Normal range of motion and neck supple. No rigidity or tenderness.     Right lower leg: No edema.     Left lower leg: No edema.  Lymphadenopathy:     Cervical: No cervical adenopathy.  Skin:    General: Skin is warm and dry.     Coloration: Skin is not jaundiced or pale.  Findings: No  bruising, erythema, lesion or rash.  Neurological:     General: No focal deficit present.     Mental Status: She is alert and oriented to person, place, and time. Mental status is at baseline.     Cranial Nerves: No cranial nerve deficit.     Sensory: No sensory deficit.     Motor: No weakness.     Coordination: Coordination normal.     Gait: Gait normal.     Deep Tendon Reflexes: Reflexes normal.  Psychiatric:        Mood and Affect: Mood normal.        Behavior: Behavior normal.        Thought Content: Thought content normal.        Judgment: Judgment normal.     No results found.  Assessment/plan: KEMIA WENDEL is a 26 y.o. 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  - reassuring neuro eval at onset was without worrisome findings.  Continue  b12 1000 mcg Qd Continue  magnesium supplement 250-450mg  qd Continue  maxalt abortive therapy  Continue vistaril prn B12 (w/supp), mag WNL 2022 Tsh collected today    Frequent urination Managed by Laurelyn Sickle tried, was not helpful.   Routine general medical examination at a health care facility Patient was encouraged to exercise greater than 150 minutes a week. Patient was encouraged to choose a diet filled with fresh fruits and vegetables, and lean meats. AVS provided to patient today for education/recommendation on gender specific health and safety maintenance.  Moderate persistent asthma without complication Stable.  Continue albuterol prn   Return in about 1 year (around 12/13/2022) for CPE (30 min), CMC (30 min).    Orders Placed This Encounter  Procedures   CBC   Comprehensive metabolic panel   TSH   Meds ordered this encounter  Medications   rizatriptan (MAXALT) 5 MG tablet    Sig: Take 1 tablet (5 mg total) by mouth as needed for migraine. May repeat in 2 hours if needed    Dispense:  10 tablet    Refill:  11   hydrOXYzine (ATARAX) 10 MG tablet    Sig:  Take 1-3 tablets (10-30 mg total) by mouth daily as needed.    Dispense:  270 tablet    Refill:  1   albuterol (VENTOLIN HFA) 108 (90 Base) MCG/ACT inhaler    Sig: Inhale 2 puffs into the lungs every 6 (six) hours as needed for wheezing.    Dispense:  1 each    Refill:  11   Referral Orders  No referral(s) requested today     Electronically signed by: Howard Pouch, [place]

## 2021-12-11 NOTE — Patient Instructions (Signed)
°Great to see you today.  °I have refilled the medication(s) we provide.  ° °If labs were collected, we will inform you of lab results once received either by echart message or telephone call.  ° - echart message- for normal results that have been seen by the patient already.  ° - telephone call: abnormal results or if patient has not viewed results in their echart. ° °Health Maintenance, Female °Adopting a healthy lifestyle and getting preventive care are important in promoting health and wellness. Ask your health care provider about: °The right schedule for you to have regular tests and exams. °Things you can do on your own to prevent diseases and keep yourself healthy. °What should I know about diet, weight, and exercise? °Eat a healthy diet ° °Eat a diet that includes plenty of vegetables, fruits, low-fat dairy products, and lean protein. °Do not eat a lot of foods that are high in solid fats, added sugars, or sodium. °Maintain a healthy weight °Body mass index (BMI) is used to identify weight problems. It estimates body fat based on height and weight. Your health care provider can help determine your BMI and help you achieve or maintain a healthy weight. °Get regular exercise °Get regular exercise. This is one of the most important things you can do for your health. Most adults should: °Exercise for at least 150 minutes each week. The exercise should increase your heart rate and make you sweat (moderate-intensity exercise). °Do strengthening exercises at least twice a week. This is in addition to the moderate-intensity exercise. °Spend less time sitting. Even light physical activity can be beneficial. °Watch cholesterol and blood lipids °Have your blood tested for lipids and cholesterol at 24 years of age, then have this test every 5 years. °Have your cholesterol levels checked more often if: °Your lipid or cholesterol levels are high. °You are older than 24 years of age. °You are at high risk for heart  disease. °What should I know about cancer screening? °Depending on your health history and family history, you may need to have cancer screening at various ages. This may include screening for: °Breast cancer. °Cervical cancer. °Colorectal cancer. °Skin cancer. °Lung cancer. °What should I know about heart disease, diabetes, and high blood pressure? °Blood pressure and heart disease °High blood pressure causes heart disease and increases the risk of stroke. This is more likely to develop in people who have high blood pressure readings or are overweight. °Have your blood pressure checked: °Every 3-5 years if you are 18-39 years of age. °Every year if you are 40 years old or older. °Diabetes °Have regular diabetes screenings. This checks your fasting blood sugar level. Have the screening done: °Once every three years after age 40 if you are at a normal weight and have a low risk for diabetes. °More often and at a younger age if you are overweight or have a high risk for diabetes. °What should I know about preventing infection? °Hepatitis B °If you have a higher risk for hepatitis B, you should be screened for this virus. Talk with your health care provider to find out if you are at risk for hepatitis B infection. °Hepatitis C °Testing is recommended for: °Everyone born from 1945 through 1965. °Anyone with known risk factors for hepatitis C. °Sexually transmitted infections (STIs) °Get screened for STIs, including gonorrhea and chlamydia, if: °You are sexually active and are younger than 24 years of age. °You are older than 24 years of age and your health care provider   tells you that you are at risk for this type of infection. °Your sexual activity has changed since you were last screened, and you are at increased risk for chlamydia or gonorrhea. Ask your health care provider if you are at risk. °Ask your health care provider about whether you are at high risk for HIV. Your health care provider may recommend a  prescription medicine to help prevent HIV infection. If you choose to take medicine to prevent HIV, you should first get tested for HIV. You should then be tested every 3 months for as long as you are taking the medicine. °Pregnancy °If you are about to stop having your period (premenopausal) and you may become pregnant, seek counseling before you get pregnant. °Take 400 to 800 micrograms (mcg) of folic acid every day if you become pregnant. °Ask for birth control (contraception) if you want to prevent pregnancy. °Osteoporosis and menopause °Osteoporosis is a disease in which the bones lose minerals and strength with aging. This can result in bone fractures. If you are 65 years old or older, or if you are at risk for osteoporosis and fractures, ask your health care provider if you should: °Be screened for bone loss. °Take a calcium or vitamin D supplement to lower your risk of fractures. °Be given hormone replacement therapy (HRT) to treat symptoms of menopause. °Follow these instructions at home: °Alcohol use °Do not drink alcohol if: °Your health care provider tells you not to drink. °You are pregnant, may be pregnant, or are planning to become pregnant. °If you drink alcohol: °Limit how much you have to: °0-1 drink a day. °Know how much alcohol is in your drink. In the [place] one drink equals one 12 oz bottle of beer (355 mL), one 5 oz glass of wine (148 mL), or one 1½ oz glass of hard liquor (44 mL). °Lifestyle °Do not use any products that contain nicotine or tobacco. These products include cigarettes, chewing tobacco, and vaping devices, such as e-cigarettes. If you need help quitting, ask your health care provider. °Do not use street drugs. °Do not share needles. °Ask your health care provider for help if you need support or information about quitting drugs. °General instructions °Schedule regular health, dental, and eye exams. °Stay current with your vaccines. °Tell your health care provider if: °You often  feel depressed. °You have ever been abused or do not feel safe at home. °Summary °Adopting a healthy lifestyle and getting preventive care are important in promoting health and wellness. °Follow your health care provider's instructions about healthy diet, exercising, and getting tested or screened for diseases. °Follow your health care provider's instructions on monitoring your cholesterol and blood pressure. °This information is not intended to replace advice given to you by your health care provider. Make sure you discuss any questions you have with your health care provider. °Document Revised: 02/12/2021 Document Reviewed: 02/12/2021 °Elsevier Patient Education © 2022 [employer] ° °

## 2021-12-13 ENCOUNTER — Ambulatory Visit (INDEPENDENT_AMBULATORY_CARE_PROVIDER_SITE_OTHER): Payer: BC Managed Care – PPO | Admitting: *Deleted

## 2021-12-13 DIAGNOSIS — J309 Allergic rhinitis, unspecified: Secondary | ICD-10-CM | POA: Diagnosis not present

## 2021-12-20 ENCOUNTER — Ambulatory Visit (INDEPENDENT_AMBULATORY_CARE_PROVIDER_SITE_OTHER): Payer: BC Managed Care – PPO

## 2021-12-20 DIAGNOSIS — J309 Allergic rhinitis, unspecified: Secondary | ICD-10-CM

## 2021-12-27 ENCOUNTER — Ambulatory Visit (INDEPENDENT_AMBULATORY_CARE_PROVIDER_SITE_OTHER): Payer: BC Managed Care – PPO

## 2021-12-27 DIAGNOSIS — J309 Allergic rhinitis, unspecified: Secondary | ICD-10-CM | POA: Diagnosis not present

## 2021-12-31 DIAGNOSIS — J3089 Other allergic rhinitis: Secondary | ICD-10-CM | POA: Diagnosis not present

## 2021-12-31 NOTE — Progress Notes (Signed)
VIALS EXP 01-01-23 ?

## 2022-01-03 ENCOUNTER — Ambulatory Visit (INDEPENDENT_AMBULATORY_CARE_PROVIDER_SITE_OTHER): Payer: BC Managed Care – PPO

## 2022-01-03 DIAGNOSIS — J309 Allergic rhinitis, unspecified: Secondary | ICD-10-CM

## 2022-01-10 ENCOUNTER — Ambulatory Visit (INDEPENDENT_AMBULATORY_CARE_PROVIDER_SITE_OTHER): Payer: BC Managed Care – PPO | Admitting: *Deleted

## 2022-01-10 DIAGNOSIS — J309 Allergic rhinitis, unspecified: Secondary | ICD-10-CM | POA: Diagnosis not present

## 2022-01-17 ENCOUNTER — Ambulatory Visit (INDEPENDENT_AMBULATORY_CARE_PROVIDER_SITE_OTHER): Payer: BC Managed Care – PPO

## 2022-01-17 DIAGNOSIS — J309 Allergic rhinitis, unspecified: Secondary | ICD-10-CM | POA: Diagnosis not present

## 2022-01-23 NOTE — Progress Notes (Signed)
? ?Follow Up Note ? ?RE: Vanessa Contreras MRN: [id number] DOB: Aug 31, 1996 ?Date of Office Visit: 01/24/2022 ? ?Referring provider: Ma Hillock, DO ?Primary care provider: Ma Hillock, DO ? ?Chief Complaint: Allergic Rhinitis  and Asthma ? ?History of Present Illness: ?I had the pleasure of seeing Vanessa Contreras for a follow up visit at the [facility] on 01/24/2022. She is a 26 y.o. female, who is being followed for allergic rhinoconjunctivitis on AIT and asthma. Her previous allergy office visit was on 07/24/2021 with Dr. Maudie Mercury. Today is a regular follow up visit. ? ?Seasonal and perennial allergic rhinoconjunctivitis ?Currently on AIT and has some mild localized reaction. ?Has not noted any significant improvements yet. ?Currently taking zyrtec '10mg'$  daily as needed and Singulair '10mg'$  daily at night with good benefit. ? ?Still has many pets at home. She can't interact with her sister's rabbit and [place] pig. ?Wants to know if we can add them into her shot regimen. ? ?Mild intermittent asthma ?Denies any SOB, coughing, wheezing, chest tightness, nocturnal awakenings, ER/urgent care visits or prednisone use since the last visit. ?No recent albuterol use. ? ?Assessment and Plan: ?Vanessa Contreras is a 26 y.o. female with: ?Seasonal and perennial allergic rhinoconjunctivitis ?Past history - Perennial rhinoconjunctivitis symptoms for 20+ years with worsening in the spring and summer.  Patient was on allergy immunotherapy 2 separate times for 5 years each with good benefit.  2 dogs, 1 rabbit, 4 birds, 1 [place] pig at home. 2022 skin testing showed: Positive to trees, weed pollen, mold, dust mites, cat, dog, rabbit, [place] pig. Started AIT on 03/29/2021 (W-T-DM, M-C-D). ?Interim history - mild localized reactions with AIT, noted mild benefit.  ?Continue environmental control measures. ?Continue allergy injections. ?Will check if we can add on rabbit and [place] pig to injections. ?Will build up new red vials using  schedule 0.1cc, 0.3cc and 0.5cc.  ?Continue Singulair (montelukast) '10mg'$  daily at night. ?Use over the counter antihistamines such as Zyrtec (cetirizine), Claritin (loratadine), Allegra (fexofenadine), or Xyzal (levocetirizine) daily as needed. May take twice a day during allergy flares. May switch antihistamines every few months. ?Declines eye drops/nasal sprays.  ? ?Mild intermittent asthma without complication ?Past history - Diagnosed with asthma 5 years ago.  Main triggers are allergies and exercise.  Used to be on Breo but now using albuterol less than once per month with good benefit. 2022 spirometry was unremarkable given effort. ?Interim history - denies symptoms and no recent albuterol use.  ?Today's spirometry showed some restriction. ?Continue Singulair (montelukast) '10mg'$  daily at night. ?May use albuterol rescue inhaler 2 puffs every 4 to 6 hours as needed for shortness of breath, chest tightness, coughing, and wheezing. May use albuterol rescue inhaler 2 puffs 5 to 15 minutes prior to strenuous physical activities. Monitor frequency of use.  ?Get spirometry at next visit. ? ?Return in about 1 year (around 01/25/2023). ? ?No orders of the defined types were placed in this encounter. ? ?Lab Orders  ?No laboratory test(s) ordered today  ? ? ?Diagnostics: ?Spirometry:  ?Tracings reviewed. Her effort: Good reproducible efforts. ?FVC: 3.18L ?FEV1: 3.06L, 86% predicted ?FEV1/FVC ratio: 96% ?Interpretation: Spirometry consistent with possible restrictive disease.  ?Please see scanned spirometry results for details. ? ?Medication List:  ?Current Outpatient Medications  ?Medication Sig Dispense Refill  ? albuterol (VENTOLIN HFA) 108 (90 Base) MCG/ACT inhaler Inhale 2 puffs into the lungs every 6 (six) hours as needed for wheezing. 1 each 11  ? EPINEPHrine (AUVI-Q) 0.3 mg/0.3  mL IJ SOAJ injection Inject 0.3 mg into the muscle as needed for anaphylaxis. 1 each 1  ? hydrOXYzine (ATARAX) 10 MG tablet Take 1-3 tablets  (10-30 mg total) by mouth daily as needed. 270 tablet 1  ? montelukast (SINGULAIR) 10 MG tablet Take 1 tablet (10 mg total) by mouth at bedtime. 30 tablet 5  ? rizatriptan (MAXALT) 5 MG tablet Take 1 tablet (5 mg total) by mouth as needed for migraine. May repeat in 2 hours if needed 10 tablet 11  ? ?No current facility-administered medications for this visit.  ? ?Allergies: ?No Known Allergies ?I reviewed her past medical history, social history, family history, and environmental history and no significant changes have been reported from her previous visit. ? ?Review of Systems  ?Constitutional:  Negative for appetite change, chills, fever and unexpected weight change.  ?HENT:  Negative for congestion, rhinorrhea and sneezing.   ?Eyes:  Negative for itching.  ?Respiratory:  Negative for cough, chest tightness, shortness of breath and wheezing.   ?Cardiovascular:  Negative for chest pain.  ?Gastrointestinal:  Negative for abdominal pain.  ?Genitourinary:  Negative for difficulty urinating.  ?Skin:  Negative for rash.  ?Allergic/Immunologic: Positive for environmental allergies.  ? ?Objective: ?BP 118/76   Pulse (!) 51   Temp 98.2 ?F (36.8 ?C)   Resp 18   Ht '5\' 7"'$  (1.702 m)   Wt 134 lb 1.9 oz (60.8 kg)   SpO2 100%   BMI 21.01 kg/m?  ?Body mass index is 21.01 kg/m?Marland Kitchen ?Physical Exam ?Vitals and nursing note reviewed.  ?Constitutional:   ?   Appearance: Normal appearance. She is well-developed.  ?HENT:  ?   Head: Normocephalic and atraumatic.  ?   Right Ear: Tympanic membrane and external ear normal.  ?   Left Ear: Tympanic membrane and external ear normal.  ?   Nose: Nose normal.  ?   Mouth/Throat:  ?   Mouth: Mucous membranes are moist.  ?   Pharynx: Oropharynx is clear.  ?Eyes:  ?   Conjunctiva/sclera: Conjunctivae normal.  ?Cardiovascular:  ?   Rate and Rhythm: Normal rate and regular rhythm.  ?   Heart sounds: Normal heart sounds. No murmur heard. ?  No friction rub. No gallop.  ?Pulmonary:  ?   Effort:  Pulmonary effort is normal.  ?   Breath sounds: Normal breath sounds. No wheezing, rhonchi or rales.  ?Musculoskeletal:  ?   Cervical back: Neck supple.  ?Skin: ?   General: Skin is warm.  ?   Findings: No rash.  ?Neurological:  ?   Mental Status: She is alert and oriented to person, place, and time.  ?Psychiatric:     ?   Behavior: Behavior normal.  ? ?Previous notes and tests were reviewed. ?The plan was reviewed with the patient/family, and all questions/concerned were addressed. ? ?It was my pleasure to see Vanessa Contreras today and participate in her care. Please feel free to contact me with any questions or concerns. ? ?Sincerely, ? ?Rexene Alberts, DO ?Allergy & Immunology ? ?[facility] ?[place] office: [phone] ?[place] office: [phone] ?

## 2022-01-24 ENCOUNTER — Encounter: Payer: Self-pay | Admitting: Allergy

## 2022-01-24 ENCOUNTER — Ambulatory Visit: Payer: Self-pay

## 2022-01-24 ENCOUNTER — Ambulatory Visit: Payer: BC Managed Care – PPO | Admitting: Allergy

## 2022-01-24 VITALS — BP 118/76 | HR 51 | Temp 98.2°F | Resp 18 | Ht 67.0 in | Wt 134.1 lb

## 2022-01-24 DIAGNOSIS — J3089 Other allergic rhinitis: Secondary | ICD-10-CM

## 2022-01-24 DIAGNOSIS — H1013 Acute atopic conjunctivitis, bilateral: Secondary | ICD-10-CM | POA: Diagnosis not present

## 2022-01-24 DIAGNOSIS — J309 Allergic rhinitis, unspecified: Secondary | ICD-10-CM

## 2022-01-24 DIAGNOSIS — J4520 Mild intermittent asthma, uncomplicated: Secondary | ICD-10-CM

## 2022-01-24 NOTE — Assessment & Plan Note (Addendum)
Past history - Perennial rhinoconjunctivitis symptoms for 20+ years with worsening in the spring and summer.  Patient was on allergy immunotherapy 2 separate times for 5 years each with good benefit.  2 dogs, 1 rabbit, 4 birds, 1 [place] pig at home. 2022 skin testing showed: Positive to trees, weed pollen, mold, dust mites, cat, dog, rabbit, [place] pig. Started AIT on 03/29/2021 (W-T-DM, M-C-D). ?Interim history - mild localized reactions with AIT, noted mild benefit.  ?? Continue environmental control measures. ?? Continue allergy injections. ?? Will check if we can add on rabbit and [place] pig to injections. ?? Will build up new red vials using schedule 0.1cc, 0.3cc and 0.5cc.  ?? Continue Singulair (montelukast) '10mg'$  daily at night. ?? Use over the counter antihistamines such as Zyrtec (cetirizine), Claritin (loratadine), Allegra (fexofenadine), or Xyzal (levocetirizine) daily as needed. May take twice a day during allergy flares. May switch antihistamines every few months. ?? Declines eye drops/nasal sprays.  ?

## 2022-01-24 NOTE — Patient Instructions (Addendum)
Environmental allergies ?2022 skin testing: Positive to trees, weed pollen, mold, dust mites, cat, dog, rabbit, [place] pig. ?Continue environmental control measures. ?Continue allergy injections.  ?Will check if we can add on rabbit and [place] pig injections.  ?Continue Singulair (montelukast) '10mg'$  daily at night. ?Use over the counter antihistamines such as Zyrtec (cetirizine), Claritin (loratadine), Allegra (fexofenadine), or Xyzal (levocetirizine) daily as needed. May take twice a day during allergy flares. May switch antihistamines every few months. ? ?Asthma: ?Continue Singulair (montelukast) '10mg'$  daily at night. ?May use albuterol rescue inhaler 2 puffs every 4 to 6 hours as needed for shortness of breath, chest tightness, coughing, and wheezing. May use albuterol rescue inhaler 2 puffs 5 to 15 minutes prior to strenuous physical activities. Monitor frequency of use.  ? ?Follow up in 12 months or sooner if needed.  ? ?

## 2022-01-24 NOTE — Assessment & Plan Note (Addendum)
Past history - Diagnosed with asthma 5 years ago.  Main triggers are allergies and exercise.  Used to be on Breo but now using albuterol less than once per month with good benefit. 2022 spirometry was unremarkable given effort. ?Interim history - denies symptoms and no recent albuterol use.  ?? Today's spirometry showed some restriction. ?? Continue Singulair (montelukast) '10mg'$  daily at night. ?? May use albuterol rescue inhaler 2 puffs every 4 to 6 hours as needed for shortness of breath, chest tightness, coughing, and wheezing. May use albuterol rescue inhaler 2 puffs 5 to 15 minutes prior to strenuous physical activities. Monitor frequency of use.  ?? Get spirometry at next visit. ?

## 2022-01-31 ENCOUNTER — Ambulatory Visit (INDEPENDENT_AMBULATORY_CARE_PROVIDER_SITE_OTHER): Payer: BC Managed Care – PPO

## 2022-01-31 DIAGNOSIS — J309 Allergic rhinitis, unspecified: Secondary | ICD-10-CM | POA: Diagnosis not present

## 2022-02-07 ENCOUNTER — Ambulatory Visit (INDEPENDENT_AMBULATORY_CARE_PROVIDER_SITE_OTHER): Payer: BC Managed Care – PPO

## 2022-02-07 DIAGNOSIS — J309 Allergic rhinitis, unspecified: Secondary | ICD-10-CM

## 2022-02-12 ENCOUNTER — Ambulatory Visit (INDEPENDENT_AMBULATORY_CARE_PROVIDER_SITE_OTHER): Payer: BC Managed Care – PPO

## 2022-02-12 DIAGNOSIS — J309 Allergic rhinitis, unspecified: Secondary | ICD-10-CM

## 2022-02-21 ENCOUNTER — Ambulatory Visit (INDEPENDENT_AMBULATORY_CARE_PROVIDER_SITE_OTHER): Payer: BC Managed Care – PPO

## 2022-02-21 DIAGNOSIS — J309 Allergic rhinitis, unspecified: Secondary | ICD-10-CM

## 2022-03-07 ENCOUNTER — Ambulatory Visit (INDEPENDENT_AMBULATORY_CARE_PROVIDER_SITE_OTHER): Payer: BC Managed Care – PPO

## 2022-03-07 DIAGNOSIS — J309 Allergic rhinitis, unspecified: Secondary | ICD-10-CM

## 2022-03-21 ENCOUNTER — Ambulatory Visit (INDEPENDENT_AMBULATORY_CARE_PROVIDER_SITE_OTHER): Payer: BC Managed Care – PPO

## 2022-03-21 DIAGNOSIS — J309 Allergic rhinitis, unspecified: Secondary | ICD-10-CM | POA: Diagnosis not present

## 2022-04-04 ENCOUNTER — Ambulatory Visit (INDEPENDENT_AMBULATORY_CARE_PROVIDER_SITE_OTHER): Payer: BC Managed Care – PPO

## 2022-04-04 DIAGNOSIS — J309 Allergic rhinitis, unspecified: Secondary | ICD-10-CM | POA: Diagnosis not present

## 2022-04-19 ENCOUNTER — Ambulatory Visit (INDEPENDENT_AMBULATORY_CARE_PROVIDER_SITE_OTHER): Payer: BC Managed Care – PPO

## 2022-04-19 DIAGNOSIS — J309 Allergic rhinitis, unspecified: Secondary | ICD-10-CM

## 2022-05-19 ENCOUNTER — Other Ambulatory Visit: Payer: Self-pay | Admitting: Family Medicine

## 2022-05-30 ENCOUNTER — Ambulatory Visit (INDEPENDENT_AMBULATORY_CARE_PROVIDER_SITE_OTHER): Payer: BC Managed Care – PPO

## 2022-05-30 DIAGNOSIS — J309 Allergic rhinitis, unspecified: Secondary | ICD-10-CM

## 2022-06-05 DIAGNOSIS — J3089 Other allergic rhinitis: Secondary | ICD-10-CM | POA: Diagnosis not present

## 2022-06-05 NOTE — Progress Notes (Signed)
VIALS EXP 06-06-23

## 2022-06-13 ENCOUNTER — Ambulatory Visit (INDEPENDENT_AMBULATORY_CARE_PROVIDER_SITE_OTHER): Payer: BC Managed Care – PPO | Admitting: *Deleted

## 2022-06-13 DIAGNOSIS — J309 Allergic rhinitis, unspecified: Secondary | ICD-10-CM

## 2022-06-27 ENCOUNTER — Ambulatory Visit (INDEPENDENT_AMBULATORY_CARE_PROVIDER_SITE_OTHER): Payer: BC Managed Care – PPO

## 2022-06-27 DIAGNOSIS — J309 Allergic rhinitis, unspecified: Secondary | ICD-10-CM | POA: Diagnosis not present

## 2022-08-22 ENCOUNTER — Ambulatory Visit (INDEPENDENT_AMBULATORY_CARE_PROVIDER_SITE_OTHER): Payer: BC Managed Care – PPO

## 2022-08-22 DIAGNOSIS — J309 Allergic rhinitis, unspecified: Secondary | ICD-10-CM

## 2022-09-04 ENCOUNTER — Ambulatory Visit (INDEPENDENT_AMBULATORY_CARE_PROVIDER_SITE_OTHER): Payer: BC Managed Care – PPO

## 2022-09-04 DIAGNOSIS — J309 Allergic rhinitis, unspecified: Secondary | ICD-10-CM

## 2022-09-13 ENCOUNTER — Ambulatory Visit (INDEPENDENT_AMBULATORY_CARE_PROVIDER_SITE_OTHER): Payer: BC Managed Care – PPO | Admitting: *Deleted

## 2022-09-13 DIAGNOSIS — J309 Allergic rhinitis, unspecified: Secondary | ICD-10-CM

## 2022-10-17 ENCOUNTER — Ambulatory Visit (INDEPENDENT_AMBULATORY_CARE_PROVIDER_SITE_OTHER): Payer: BC Managed Care – PPO

## 2022-10-17 DIAGNOSIS — J309 Allergic rhinitis, unspecified: Secondary | ICD-10-CM

## 2022-10-21 ENCOUNTER — Other Ambulatory Visit: Payer: Self-pay

## 2022-10-21 ENCOUNTER — Encounter: Payer: Self-pay | Admitting: Family Medicine

## 2022-10-22 NOTE — Telephone Encounter (Signed)
Please advise on therapy.

## 2022-10-25 ENCOUNTER — Ambulatory Visit (INDEPENDENT_AMBULATORY_CARE_PROVIDER_SITE_OTHER): Payer: BC Managed Care – PPO

## 2022-10-25 DIAGNOSIS — J309 Allergic rhinitis, unspecified: Secondary | ICD-10-CM | POA: Diagnosis not present

## 2022-11-06 ENCOUNTER — Ambulatory Visit (INDEPENDENT_AMBULATORY_CARE_PROVIDER_SITE_OTHER): Payer: BC Managed Care – PPO | Admitting: Psychology

## 2022-11-06 DIAGNOSIS — F331 Major depressive disorder, recurrent, moderate: Secondary | ICD-10-CM

## 2022-11-06 DIAGNOSIS — F411 Generalized anxiety disorder: Secondary | ICD-10-CM
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
## 2022-11-06 NOTE — Progress Notes (Signed)
                Thad Osoria, LCMHC ?

## 2022-11-20 ENCOUNTER — Ambulatory Visit: Payer: BC Managed Care – PPO | Admitting: Psychology

## 2022-11-20 DIAGNOSIS — F331 Major depressive disorder, recurrent, moderate: Secondary | ICD-10-CM | POA: Diagnosis not present

## 2022-11-20 DIAGNOSIS — F411 Generalized anxiety disorder: Secondary | ICD-10-CM

## 2022-11-20 NOTE — Progress Notes (Signed)
? ? ? ? ? ? ? ? ? ? ? ? ? ? ?  Breia Ocampo, LCMHC ?

## 2022-11-20 NOTE — Progress Notes (Signed)
[facility] Counselor/Therapist Progress Note  Patient ID: Vanessa Contreras, MRN: [id number],    Date: 11/20/2022  Time Spent: 11:00am-11:56am     Treatment Type: Individual Therapy  Pt is seen for a virtual video visit via caregility.  Pt joins from her home, reporting privacy, and counselor from her home office.   Reported Symptoms: increased feeling down past couple of days, talked w/ ex over weekend, some feelings of guilt.    Mental Status Exam: Appearance:  Well Groomed     Behavior: Appropriate  Motor: Normal  Speech/Language:  Normal Rate  Affect: Appropriate  Mood: depressed  Thought process: normal  Thought content:   WNL  Sensory/Perceptual disturbances:   WNL  Orientation: oriented to person, place, time/date, and situation  Attention: Good  Concentration: Good  Memory: WNL  Fund of knowledge:  Good  Insight:   Good  Judgment:  Good  Impulse Control: Good   Risk Assessment: Danger to Self:  No Self-injurious Behavior: No Danger to Others: No Duty to Warn:no Physical Aggression / Violence:Yes  Access to Firearms a concern: No  Gang Involvement:No   Subjective: counselor assessed pt current functioning per pt report.  Processed w/pt emotions and interaction w/ ex and related feelings.  Assisted pt in recognizing distortions related to guilt emotions.  Validated range of emotions and explored recognizing healthy boundaries for self and not responsible for helping ex work through his emotions/get closure.  Discussed positives of engaging w/ activities and friends for continued coping.  Pt affect wnl.  Pt reported that has felt down past couple of days and recognizes might be impact of interaction w/ ex over weekend.  Pt reported agreed to meet w/ hm for opportunity for his closure.  Pt reported didn't go well to her- he started w/ not trying to get back together, but turned into blaming her, blaming her friend, a lot of emotions from him- anger hurt and  recognized wasn't productive.  Pt reported she set boundaries- not going to meet monthly, not going to say can try again in couple of years, etc. And that not going to have any contact.  Pt expressed some guilt and was able to reframe that didn't do to hurt him and not responsibility for his feelings ore working through is feelings.  Pt reported that was helpful to be busy afterwards and that when more down time increased rumination.  Pt discussed connection w/ friends and positives w/ being engaged w/ things.      Interventions: Cognitive Behavioral Therapy, Assertiveness/Communication, and Self care  Diagnosis:Generalized anxiety disorder  Major depressive disorder, recurrent episode, moderate (HCC)  Plan: Pt to f/u w/ in 2 weeks for counseling.  Pt to f/u as scheduled w/ PCP.   Individualized Treatment Plan Strengths: martial arts and working out.  Her pets.  Supports: mom and good friend from grad school    Goal/Needs for Treatment:  In order of importance to patient 1) increased awareness for self 2) coping skills 3) express emotions effectively    Client Statement of Needs:  "Know the what and why things (anxiety/depression) are happening so I can get through and help cope.  No longer stuff things down and think I will be ok.  I have found that helped to talk about things recently"    Treatment Level:outpatient counseling.    Symptoms:anxiety, on edge, poor sleep, fatigue, depressed mood, loss of interest.difficulty concentrating.  Client Treatment Preferences: counseling every 2 weeks.  No medication at this time.  Healthcare consumer's goal for treatment:   Counselor, Jan Fireman, [facility] will support the patient's ability to achieve the goals identified. Cognitive Behavioral Therapy, Assertive Communication/Conflict Resolution Training, Relaxation Training, ACT, Humanistic and other evidenced-based practices will be used to promote progress towards healthy functioning.     Healthcare consumer will: Actively participate in therapy, working towards healthy functioning.     *Justification for Continuation/Discontinuation of Goal: R=Revised, O=Ongoing, A=Achieved, D=Discontinued   Goal 1) Increased awareness of connection of thoughts, feelings, behaviors to assist in challenging and reframing to improve coping w/ stressors per pt reports/therapist observation. Baseline date 11/06/22: Progress towards goal 0; How Often - Daily Target Date Goal Was reviewed Status Code Progress towards goal/Likert rating  11/07/23                            Goal 2) Increase expression of feelings and awareness of how stressors impacting mood AEB pt reports/therapist observation.  Baseline date 11/06/22: Progress towards goal 0; How Often - Daily Target Date Goal Was reviewed Status Code Progress towards goal  11/07/23                            Goal 3) increased effective coping skills and self care to manage and decrease symptoms of  anxiety and depression.  Baseline date 11/06/22: Progress towards goal 0; How Often - Daily Target Date Goal Was reviewed Status Code Progress towards goal  11/07/23                            This plan has been reviewed and created by the following participants:  This plan will be reviewed at least every 12 months. Date Behavioral Health Clinician Date Guardian/Patient   11/06/22 Jan Fireman, [facility]              11/06/22 Verbal Consent Provided                      Jan Fireman, [facility]

## 2022-12-02 ENCOUNTER — Ambulatory Visit: Payer: BC Managed Care – PPO | Admitting: Psychology

## 2022-12-02 DIAGNOSIS — F411 Generalized anxiety disorder: Secondary | ICD-10-CM

## 2022-12-02 DIAGNOSIS — F331 Major depressive disorder, recurrent, moderate: Secondary | ICD-10-CM

## 2022-12-02 NOTE — Progress Notes (Signed)
[facility] Counselor/Therapist Progress Note  Patient ID: JAMIRIA HANNOLD, MRN: [id number],    Date: 12/02/2022  Time Spent: 10:00am-10:50am     Treatment Type: Individual Therapy  Pt is seen for a virtual video visit via caregility.  Pt joins from her home, reporting privacy, and counselor from her home office.   Reported Symptoms: difficult day yesterday w/ increased interaction w/ ex, improved once set boundary  Mental Status Exam: Appearance:  Well Groomed     Behavior: Appropriate  Motor: Normal  Speech/Language:  Normal Rate  Affect: Appropriate  Mood: sad  Thought process: normal  Thought content:   WNL  Sensory/Perceptual disturbances:   WNL  Orientation: oriented to person, place, time/date, and situation  Attention: Good  Concentration: Good  Memory: WNL  Fund of knowledge:  Good  Insight:   Good  Judgment:  Good  Impulse Control: Good   Risk Assessment: Danger to Self:  No Self-injurious Behavior: No Danger to Others: No Duty to Warn:no Physical Aggression / Violence:Yes  Access to Firearms a concern: No  Gang Involvement:No   Subjective: counselor assessed pt current functioning per pt report.  Processed w/pt emotions and pattern w/ interaction w/ ex.  Reflected positive of boundaries setting and awareness of improved once clear boundary set.  Discussed thoughts re: to guilt if doesn't respond to ex.  Explored ways of managing her emotions on and reframing.  Explored positives and things looking forward to.  Pt affect wnl.  Pt reported that she had been doing well w/ emotions until weekend when ex started messaging again following contact at event they were volunteering at.  Pt reported that she was able to set some boundaries and no to meeting face to face.  Pt expressed felt bad w/ not responding- guilt that he is hurting because of her break up.  Pt discussed how had to stop responding and then messages stopped.  Pt recognized the pattern and that  she will have to change her response.  Pt discussed distortion of need to help him and ways she can express that she is not the one to support. Pt discussed how this is pattern for her to want to help fix things for others.  Pt  discussed how was helpful to distract w/ other things.  Pt recognized balance of feeling and distracting to not ruminate on.  Pt discussed positive looking forward to in coming week w/ friends.   Interventions: Cognitive Behavioral Therapy, Assertiveness/Communication, and Self care  Diagnosis:Generalized anxiety disorder  Major depressive disorder, recurrent episode, moderate (HCC)  Plan: Pt to f/u w/ in 2 weeks for counseling.  Pt to f/u as scheduled w/ PCP.   Individualized Treatment Plan Strengths: martial arts and working out.  Her pets.  Supports: mom and good friend from grad school    Goal/Needs for Treatment:  In order of importance to patient 1) increased awareness for self 2) coping skills 3) express emotions effectively    Client Statement of Needs:  "Know the what and why things (anxiety/depression) are happening so I can get through and help cope.  No longer stuff things down and think I will be ok.  I have found that helped to talk about things recently"    Treatment Level:outpatient counseling.    Symptoms:anxiety, on edge, poor sleep, fatigue, depressed mood, loss of interest.difficulty concentrating.  Client Treatment Preferences: counseling every 2 weeks.  No medication at this time.     Healthcare consumer's goal for treatment:  Counselor, Jan Fireman, [facility] will support the patient's ability to achieve the goals identified. Cognitive Behavioral Therapy, Assertive Communication/Conflict Resolution Training, Relaxation Training, ACT, Humanistic and other evidenced-based practices will be used to promote progress towards healthy functioning.    Healthcare consumer will: Actively participate in therapy, working towards healthy functioning.      *Justification for Continuation/Discontinuation of Goal: R=Revised, O=Ongoing, A=Achieved, D=Discontinued   Goal 1) Increased awareness of connection of thoughts, feelings, behaviors to assist in challenging and reframing to improve coping w/ stressors per pt reports/therapist observation. Baseline date 11/06/22: Progress towards goal 0; How Often - Daily Target Date Goal Was reviewed Status Code Progress towards goal/Likert rating  11/07/23                            Goal 2) Increase expression of feelings and awareness of how stressors impacting mood AEB pt reports/therapist observation.  Baseline date 11/06/22: Progress towards goal 0; How Often - Daily Target Date Goal Was reviewed Status Code Progress towards goal  11/07/23                            Goal 3) increased effective coping skills and self care to manage and decrease symptoms of  anxiety and depression.  Baseline date 11/06/22: Progress towards goal 0; How Often - Daily Target Date Goal Was reviewed Status Code Progress towards goal  11/07/23                            This plan has been reviewed and created by the following participants:  This plan will be reviewed at least every 12 months. Date Behavioral Health Clinician Date Guardian/Patient   11/06/22 Jan Fireman, [facility]              11/06/22 Verbal Consent Provided                           Jan Fireman, [facility]

## 2022-12-13 ENCOUNTER — Encounter: Payer: Self-pay | Admitting: Family Medicine

## 2022-12-13 ENCOUNTER — Ambulatory Visit (INDEPENDENT_AMBULATORY_CARE_PROVIDER_SITE_OTHER): Payer: BC Managed Care – PPO | Admitting: Family Medicine

## 2022-12-13 ENCOUNTER — Ambulatory Visit (INDEPENDENT_AMBULATORY_CARE_PROVIDER_SITE_OTHER): Payer: BC Managed Care – PPO

## 2022-12-13 VITALS — BP 112/71 | HR 72 | Temp 98.2°F | Ht 66.34 in | Wt 133.8 lb

## 2022-12-13 DIAGNOSIS — Z130 Encounter for screening for diseases of the blood and blood-forming organs and certain disorders involving the immune mechanism: Secondary | ICD-10-CM | POA: Diagnosis not present

## 2022-12-13 DIAGNOSIS — Z13220 Encounter for screening for lipoid disorders: Secondary | ICD-10-CM

## 2022-12-13 DIAGNOSIS — Z0000 Encounter for general adult medical examination without abnormal findings: Secondary | ICD-10-CM | POA: Diagnosis not present

## 2022-12-13 DIAGNOSIS — Z789 Other specified health status: Secondary | ICD-10-CM | POA: Diagnosis not present

## 2022-12-13 DIAGNOSIS — J309 Allergic rhinitis, unspecified: Secondary | ICD-10-CM

## 2022-12-13 DIAGNOSIS — Z7689 Persons encountering health services in other specified circumstances: Secondary | ICD-10-CM

## 2022-12-13 LAB — CBC
HCT: 41.6 % (ref 36.0–46.0)
Hemoglobin: 14.3 g/dL (ref 12.0–15.0)
MCHC: 34.4 g/dL (ref 30.0–36.0)
MCV: 91.6 fl (ref 78.0–100.0)
Platelets: 341 10*3/uL (ref 150.0–400.0)
RBC: 4.54 Mil/uL (ref 3.87–5.11)
RDW: 13 % (ref 11.5–15.5)
WBC: 4.4 10*3/uL (ref 4.0–10.5)

## 2022-12-13 LAB — COMPREHENSIVE METABOLIC PANEL
ALT: 13 U/L (ref 0–35)
AST: 18 U/L (ref 0–37)
Albumin: 4 g/dL (ref 3.5–5.2)
Alkaline Phosphatase: 60 U/L (ref 39–117)
BUN: 15 mg/dL (ref 6–23)
CO2: 28 mEq/L (ref 19–32)
Calcium: 9.4 mg/dL (ref 8.4–10.5)
Chloride: 105 mEq/L (ref 96–112)
Creatinine, Ser: 0.9 mg/dL (ref 0.40–1.20)
GFR: 89.47 mL/min (ref >60.00)
Glucose, Bld: 91 mg/dL (ref 70–99)
Potassium: 4.1 mEq/L (ref 3.5–5.1)
Sodium: 139 mEq/L (ref 135–145)
Total Bilirubin: 0.4 mg/dL (ref 0.2–1.2)
Total Protein: 6.8 g/dL (ref 6.0–8.3)

## 2022-12-13 MED ORDER — ALBUTEROL SULFATE HFA 108 (90 BASE) MCG/ACT IN AERS: 2.0000 | INHALATION_SPRAY | Freq: Four times a day (QID) | RESPIRATORY_TRACT | 11 refills | Status: DC | PRN

## 2022-12-13 MED ORDER — RIZATRIPTAN BENZOATE 5 MG PO TABS: 5.0000 mg | ORAL_TABLET | ORAL | 11 refills | Status: DC | PRN

## 2022-12-13 MED ORDER — HYDROXYZINE HCL 10 MG PO TABS: 10.0000 mg | ORAL_TABLET | Freq: Every day | ORAL | 1 refills | Status: DC | PRN

## 2022-12-13 NOTE — Patient Instructions (Addendum)
Return in about 1 year (around 12/15/2023) for cpe (20 min).        Great to see you today.  I have refilled the medication(s) we provide.   If labs were collected, we will inform you of lab results once received either by echart message or telephone call.   - echart message- for normal results that have been seen by the patient already.   - telephone call: abnormal results or if patient has not viewed results in their echart.

## 2022-12-13 NOTE — Progress Notes (Signed)
Patient ID: Vanessa Contreras, female  DOB: 08/14/1996, 27 y.o.   MRN: [id number] Patient Care Team    Relationship Specialty Notifications Start End  Ma Hillock, DO PCP - General Family Medicine  10/09/20   Garnet Sierras, DO Consulting Physician Allergy  12/11/21   Marshell Garfinkel, MD Consulting Physician Pulmonary Disease  12/11/21   Trula Slade, DPM Consulting Physician Podiatry  12/11/21   Melida Quitter, MD Consulting Physician Otolaryngology  12/11/21     Chief Complaint  Patient presents with   Annual Exam    Cmc; pt is fasting    Subjective: Vanessa Contreras is a 27 y.o.  Female  present for CPE All past medical history, surgical history, allergies, family history, immunizations, medications and social history were updated in the electronic medical record today. All recent labs, ED visits and hospitalizations within the last year were reviewed.  Health maintenance:  Colonoscopy: no fhx. Start screen at 35 Mammogram: no fhx. Routine screen at 42 Cervical cancer screening: making an appt to establish at gyn> never SA Immunizations: tdap - UTD 2022, Influenza declined (encouraged yearly), covid series completed x3. HPV series completed.  Infectious disease screening: HIV and hepatitis C screenings completed  Patient has a Dental home. Hospitalizations/ED visits: reviewed   Migraine: headaches are well controlled.  She states she has not had a migraine in awhile. She use to have an aura with migraines, but that had subsided.reports they have changed. The headaches respond in about an hour to maxalt, but sometimes return the next day and she repeats the maxalt. She has continued  the b12 and magnesium supplements.  Prior note: Pt reports she started having migraines when she was about 27 or 27 yrs old. She had been elevated by neurology at that time (Dr. Jannifer Franklin and ped neuro) for possible causes with normal EEG and work up at that time. She reports since onset her migraines have been  associated with unilateral paraesthesia, weakness and Aura. Symptoms will affect her face, arm and sometimes leg. Initially, her headaches started on the left side of her head (and body) and now present on either right or left side. She states she use to only have a migraine 1-2x a year. Over the last 6 months she has had an increase in her headaches to 1-2 a month. She denies any medication changes over this time. She is prescribed BCP by prior PCP. She has been on the same BCP for over 2 yrs. Her mother also has a h/o chronic  migraines. She reports she has never been tried on any medications. She usually will take an NSAID and lay down.        12/13/2022    8:20 AM 12/11/2021    8:27 AM 12/06/2020    8:24 AM 10/09/2020   11:06 AM  Depression screen PHQ 2/9  Decreased Interest 0 0 0 0  Down, Depressed, Hopeless 0 0 0 0  PHQ - 2 Score 0 0 0 0  Altered sleeping 0     Tired, decreased energy 0     Change in appetite 0     Feeling bad or failure about yourself  0     Trouble concentrating 0     Moving slowly or fidgety/restless 0     Suicidal thoughts 0     PHQ-9 Score 0         12/13/2022    8:20 AM  GAD 7 : Generalized Anxiety Score  Nervous, Anxious, on Edge 0  Control/stop worrying 0  Worry too much - different things 0  Trouble relaxing 0  Restless 0  Easily annoyed or irritable 0  Afraid - awful might happen 0  Total GAD 7 Score 0     Immunization History  Administered Date(s) Administered   DTaP 08/04/1998, 10/03/1998, 11/27/1998, 09/04/1999, 05/10/2002   HIB (PRP-OMP) 08/04/1998, 10/03/1998, 11/27/1998, 09/04/1999   Hepatitis A 07/11/2005, 08/04/2006   Hepatitis B 11/27/1998, 02/26/1999, 09/04/1999   Hpv-Unspecified 10/05/2012, 12/15/2012, 04/26/2013   IPV 08/04/1998, 10/03/1998, 07/03/1999, 05/10/2002, 05/10/2003   Influenza Split 06/02/2017   Influenza, Seasonal, Injecte, Preservative Fre 05/21/2014   Influenza,inj,Quad PF,6+ Mos 05/23/2015, 08/06/2021    Influenza-Unspecified 08/07/2020   MMR 07/03/1999, 05/10/2002   MenQuadfi_Meningococcal Groups ACYW Conjugate 02/01/2015   Meningococcal Conjugate 11/30/2009   Moderna Covid-19 Vaccine Bivalent Booster 3yr & up 04/22/2022   PFIZER(Purple Top)SARS-COV-2 Vaccination 12/11/2019, 01/01/2020, 09/22/2020   Tdap 11/30/2009, 12/06/2020   Varicella 06/08/1999, 08/16/2011     Past Medical History:  Diagnosis Date   Allergy    Asthma    exercise induced   Epidermal cyst of neck 03/25/2013   Migraines    Numbness on left side 05/11/2016   No Known Allergies Past Surgical History:  Procedure Laterality Date   EAR CYST EXCISION Left 03/25/2013   Procedure: EXCISION OF A LEFT POSTERIOR AURICULAR CYST;  Surgeon: CTheodoro Kos DO;  Location: [facility]  Service: Plastics;  Laterality: Left;   HAND SURGERY     giant cell tumor removed lt hand   Family History  Problem Relation Age of Onset   Migraines Mother    M25/ [place]    Allergic rhinitis Mother    Allergic rhinitis Sister    Allergic rhinitis Brother    Depression Maternal Grandmother    Anxiety disorder Maternal Grandmother    Allergic rhinitis Sister    Allergic rhinitis Sister    Testicular cancer Father    Social History   Social History Narrative   Marital status/children/pets: Single   Education/employment: grad sAdministrator      -Wears a bicycle helmet riding a bike: Yes     -smoke alarm in the home:Yes     - wears seatbelt: Yes     - Feels safe in their relationships: Yes    Allergies as of 12/13/2022   No Known Allergies      Medication List        Accurate as of December 13, 2022  8:32 AM. If you have any questions, ask your nurse or doctor.          STOP taking these medications    norethindrone 0.35 MG tablet Commonly known as: MICRONOR Stopped by: RHoward Pouch DO       TAKE these medications    albuterol 108 (90 Base) MCG/ACT inhaler Commonly known  as: VENTOLIN HFA Inhale 2 puffs into the lungs every 6 (six) hours as needed for wheezing.   EPINEPHrine 0.3 mg/0.3 mL Soaj injection Commonly known as: Auvi-Q Inject 0.3 mg into the muscle as needed for anaphylaxis.   hydrOXYzine 10 MG tablet Commonly known as: ATARAX Take 1-3 tablets (10-30 mg total) by mouth daily as needed.   montelukast 10 MG tablet Commonly known as: Singulair Take 1 tablet (10 mg total) by mouth at bedtime.   rizatriptan 5 MG tablet Commonly known as: MAXALT Take 1 tablet (5 mg total) by mouth as needed for migraine. May repeat  in 2 hours if needed        All past medical history, surgical history, allergies, family history, immunizations andmedications were updated in the EMR today and reviewed under the history and medication portions of their EMR.     No results found for this or any previous visit (from the past 2160 hour(s)).   ROS 14 pt review of systems performed and negative (unless mentioned in an HPI)  Objective: BP 112/71   Pulse 72   Temp 98.2 F (36.8 C)   Ht 5' 6.34" (1.685 m)   Wt 133 lb 12.8 oz (60.7 kg)   LMP 12/06/2022 (Approximate)   SpO2 99%   BMI 21.38 kg/m  Physical Exam Vitals and nursing note reviewed.  Constitutional:      General: She is not in acute distress.    Appearance: Normal appearance. She is not ill-appearing or toxic-appearing.  HENT:     Head: Normocephalic and atraumatic.     Right Ear: Tympanic membrane, ear canal and external ear normal. There is no impacted cerumen.     Left Ear: Tympanic membrane, ear canal and external ear normal. There is no impacted cerumen.     Nose: No congestion or rhinorrhea.     Mouth/Throat:     Mouth: Mucous membranes are moist.     Pharynx: Oropharynx is clear. No oropharyngeal exudate or posterior oropharyngeal erythema.  Eyes:     General: No scleral icterus.       Right eye: No discharge.        Left eye: No discharge.     Extraocular Movements: Extraocular  movements intact.     Conjunctiva/sclera: Conjunctivae normal.     Pupils: Pupils are equal, round, and reactive to light.  Cardiovascular:     Rate and Rhythm: Normal rate and regular rhythm.     Pulses: Normal pulses.     Heart sounds: Normal heart sounds. No murmur heard.    No friction rub. No gallop.  Pulmonary:     Effort: Pulmonary effort is normal. No respiratory distress.     Breath sounds: Normal breath sounds. No stridor. No wheezing, rhonchi or rales.  Chest:     Chest wall: No tenderness.  Abdominal:     General: Abdomen is flat. Bowel sounds are normal. There is no distension.     Palpations: Abdomen is soft. There is no mass.     Tenderness: There is no abdominal tenderness. There is no right CVA tenderness, left CVA tenderness, guarding or rebound.     Hernia: No hernia is present.  Musculoskeletal:        General: No swelling, tenderness or deformity. Normal range of motion.     Cervical back: Normal range of motion and neck supple. No rigidity or tenderness.     Right lower leg: No edema.     Left lower leg: No edema.  Lymphadenopathy:     Cervical: No cervical adenopathy.  Skin:    General: Skin is warm and dry.     Coloration: Skin is not jaundiced or pale.     Findings: No bruising, erythema, lesion or rash.  Neurological:     General: No focal deficit present.     Mental Status: She is alert and oriented to person, place, and time. Mental status is at baseline.     Cranial Nerves: No cranial nerve deficit.     Sensory: No sensory deficit.     Motor: No weakness.  Coordination: Coordination normal.     Gait: Gait normal.     Deep Tendon Reflexes: Reflexes normal.  Psychiatric:        Mood and Affect: Mood normal.        Behavior: Behavior normal.        Thought Content: Thought content normal.        Judgment: Judgment normal.      No results found.  Assessment/plan: Vanessa Contreras is a 27 y.o. female present for cpe Hemiplegic migraine without  status migrainosus, not intractable Stable - reassuring neuro eval at onset was without worrisome findings.  Continue b12 1000 mcg Qd Continue magnesium supplement 250-'450mg'$  qd Continue maxalt abortive therapy  Continue vistaril prn  Moderate persistent asthma without complication Stable Continue albuterol prn  Routine general medical examination at a health care facility Patient was encouraged to exercise greater than 150 minutes a week. Patient was encouraged to choose a diet filled with fresh fruits and vegetables, and lean meats. AVS provided to patient today for education/recommendation on gender specific health and safety maintenance. Colonoscopy: no fhx. Start screen at 26 Mammogram: no fhx. Routine screen at 42 Cervical cancer screening: making an appt to establish at gyn> never SA Immunizations: tdap - UTD 2022, Influenza UTD 2022(encouraged yearly), covid series completed x3. HPV series completed.  Infectious disease screening: HIV and hepatitis C screenings completed   Return in about 1 year (around 12/15/2023) for cpe (20 min).    Orders Placed This Encounter  Procedures   CBC   Comprehensive metabolic panel   Ambulatory referral to Gynecology   Meds ordered this encounter  Medications   albuterol (VENTOLIN HFA) 108 (90 Base) MCG/ACT inhaler    Sig: Inhale 2 puffs into the lungs every 6 (six) hours as needed for wheezing.    Dispense:  1 each    Refill:  11   rizatriptan (MAXALT) 5 MG tablet    Sig: Take 1 tablet (5 mg total) by mouth as needed for migraine. May repeat in 2 hours if needed    Dispense:  10 tablet    Refill:  11   hydrOXYzine (ATARAX) 10 MG tablet    Sig: Take 1-3 tablets (10-30 mg total) by mouth daily as needed.    Dispense:  90 tablet    Refill:  1   Referral Orders         Ambulatory referral to Gynecology       Electronically signed by: Howard Pouch, DO [place]

## 2022-12-18 ENCOUNTER — Ambulatory Visit (INDEPENDENT_AMBULATORY_CARE_PROVIDER_SITE_OTHER): Payer: BC Managed Care – PPO | Admitting: Psychology

## 2022-12-18 DIAGNOSIS — F411 Generalized anxiety disorder: Secondary | ICD-10-CM | POA: Diagnosis not present

## 2022-12-18 DIAGNOSIS — F331 Major depressive disorder, recurrent, moderate: Secondary | ICD-10-CM

## 2022-12-18 NOTE — Progress Notes (Signed)
[place] Counselor/Therapist Progress Note  Patient ID: Vanessa Contreras, MRN: [id number],    Date: 12/18/2022  Time Spent: 11:01am-11:50am     Treatment Type: Individual Therapy  Pt is seen for a virtual video visit via caregility.  Pt joins from her home, reporting privacy, and counselor from her home office.   Reported Symptoms: positive interactions w/ friends,  some anger, sadness and indifference feelings  Mental Status Exam: Appearance:  Well Groomed     Behavior: Appropriate  Motor: Normal  Speech/Language:  Normal Rate  Affect: Appropriate  Mood: sad  Thought process: normal  Thought content:   WNL  Sensory/Perceptual disturbances:   WNL  Orientation: oriented to person, place, time/date, and situation  Attention: Good  Concentration: Good  Memory: WNL  Fund of knowledge:  Good  Insight:   Good  Judgment:  Good  Impulse Control: Good   Risk Assessment: Danger to Self:  No Self-injurious Behavior: No Danger to Others: No Duty to Warn:no Physical Aggression / Violence:Yes  Access to Firearms a concern: No  Gang Involvement:No   Subjective: counselor assessed pt current functioning per pt report.  Processed w/pt emotions and and interactions.  Discussed parents interactions and how impacts her and validated that.  Discussed ways of allowing for expression and recognizing not indifferent.  Explored interactions w/ friends and building relationships and impact has for her growth. Reiterated counseling as process and building as safe space for her.  Pt affect wnl.  Pt reported that she enjoyed time w/ friends away for the day.  Pt reported on dad's behavior towards mom and how angers her to see and feels sad for.  Pt also discussed how hard to see mom put up w/. Pt recognized how is impacted her and not able to just ignore feelings on.  Pt reported no further contact from ex and was helpful to put on mute to decrease her anxiety about. Pt insight into her  friendships and building those relationships has helped her to recognize what healthy and not.  Pt acknowledged that she is guarded about some things in tx and that will take time to build towards expressing.  Pt expressed desire for more frequent counseling.    Interventions: Cognitive Behavioral Therapy, Assertiveness/Communication, and Self care  Diagnosis:Generalized anxiety disorder  Major depressive disorder, recurrent episode, moderate (HCC)  Plan: Pt to f/u w/ in 1 week for counseling.  Pt to f/u as scheduled w/ PCP.   Individualized Treatment Plan Strengths: martial arts and working out.  Her pets.  Supports: mom and good friend from grad school    Goal/Needs for Treatment:  In order of importance to patient 1) increased awareness for self 2) coping skills 3) express emotions effectively    Client Statement of Needs:  "Know the what and why things (anxiety/depression) are happening so I can get through and help cope.  No longer stuff things down and think I will be ok.  I have found that helped to talk about things recently"    Treatment Level:outpatient counseling.    Symptoms:anxiety, on edge, poor sleep, fatigue, depressed mood, loss of interest.difficulty concentrating.  Client Treatment Preferences: counseling every 2 weeks.  No medication at this time.     Healthcare consumer's goal for treatment:   Counselor, Jan Fireman, [facility] will support the patient's ability to achieve the goals identified. Cognitive Behavioral Therapy, Assertive Communication/Conflict Resolution Training, Relaxation Training, ACT, Humanistic and other evidenced-based practices will be used to promote progress towards healthy  functioning.    Healthcare consumer will: Actively participate in therapy, working towards healthy functioning.     *Justification for Continuation/Discontinuation of Goal: R=Revised, O=Ongoing, A=Achieved, D=Discontinued   Goal 1) Increased awareness of connection of  thoughts, feelings, behaviors to assist in challenging and reframing to improve coping w/ stressors per pt reports/therapist observation. Baseline date 11/06/22: Progress towards goal 0; How Often - Daily Target Date Goal Was reviewed Status Code Progress towards goal/Likert rating  11/07/23                            Goal 2) Increase expression of feelings and awareness of how stressors impacting mood AEB pt reports/therapist observation.  Baseline date 11/06/22: Progress towards goal 0; How Often - Daily Target Date Goal Was reviewed Status Code Progress towards goal  11/07/23                            Goal 3) increased effective coping skills and self care to manage and decrease symptoms of  anxiety and depression.  Baseline date 11/06/22: Progress towards goal 0; How Often - Daily Target Date Goal Was reviewed Status Code Progress towards goal  11/07/23                            This plan has been reviewed and created by the following participants:  This plan will be reviewed at least every 12 months. Date Behavioral Health Clinician Date Guardian/Patient   11/06/22 Jan Fireman, [facility]              11/06/22 Verbal Consent Provided                          Jan Fireman, [facility]

## 2022-12-20 ENCOUNTER — Ambulatory Visit (INDEPENDENT_AMBULATORY_CARE_PROVIDER_SITE_OTHER): Payer: BC Managed Care – PPO

## 2022-12-20 DIAGNOSIS — J309 Allergic rhinitis, unspecified: Secondary | ICD-10-CM

## 2022-12-23 ENCOUNTER — Ambulatory Visit: Payer: BC Managed Care – PPO | Admitting: Psychology

## 2022-12-23 DIAGNOSIS — F331 Major depressive disorder, recurrent, moderate: Secondary | ICD-10-CM | POA: Diagnosis not present

## 2022-12-23 DIAGNOSIS — F411 Generalized anxiety disorder: Secondary | ICD-10-CM

## 2022-12-23 NOTE — Progress Notes (Signed)
[place] Counselor/Therapist Progress Note  Patient ID: Vanessa Contreras, MRN: [id number],    Date: 12/23/2022  Time Spent: 11:05am-11:50am     Treatment Type: Individual Therapy  Pt is seen for a virtual video visit via caregility.  Pt joins from her home, reporting privacy, and counselor from her home office.   Reported Symptoms: day of feeling emotionally escalated, detaching   Mental Status Exam: Appearance:  Well Groomed     Behavior: Appropriate  Motor: Normal  Speech/Language:  Normal Rate  Affect: Appropriate  Mood: sad  Thought process: normal  Thought content:   WNL  Sensory/Perceptual disturbances:   WNL  Orientation: oriented to person, place, time/date, and situation  Attention: Good  Concentration: Good  Memory: WNL  Fund of knowledge:  Good  Insight:   Good  Judgment:  Good  Impulse Control: Good   Risk Assessment: Danger to Self:  No Self-injurious Behavior: No Danger to Others: No Duty to Warn:no Physical Aggression / Violence:Yes  Access to Firearms a concern: No  Gang Involvement:No   Subjective: counselor assessed pt current functioning per pt report.  Processed w/pt emotions and and regulating emotions.  Discussed idea of wise mind and ways of using mindfulness to acknowledge emotions and other things in present.  Discussed grounding spaces, people, things, practices and taking time to be intentional about grounding practices.  Encouraged ways to practice daily.  Pt affect wnl.  Pt reported on some positive from the past week and reported difficult day on Friday.  Pt reports no external stressors- pt reports some days finds that she gets stuck in her emotions and finds difficult to regulate.  Pt reports at times feels that detached from emotions to get through. Pt discussed awareness of negative self talk when facing these days.  Pt receptive to approach of naming emotions w/ out judgement on self and to allow self to acknowledge human, what  other things are present and ways to get through.  Pt agrees for intentional mindful practice this next week.    Interventions: Cognitive Behavioral Therapy, Assertiveness/Communication, and Self care  Diagnosis:Generalized anxiety disorder  Major depressive disorder, recurrent episode, moderate (HCC)  Plan: Pt to f/u w/ in 1 week for counseling.  Pt to f/u as scheduled w/ PCP.   Individualized Treatment Plan Strengths: martial arts and working out.  Her pets.  Supports: mom and good friend from grad school    Goal/Needs for Treatment:  In order of importance to patient 1) increased awareness for self 2) coping skills 3) express emotions effectively    Client Statement of Needs:  "Know the what and why things (anxiety/depression) are happening so I can get through and help cope.  No longer stuff things down and think I will be ok.  I have found that helped to talk about things recently"    Treatment Level:outpatient counseling.    Symptoms:anxiety, on edge, poor sleep, fatigue, depressed mood, loss of interest.difficulty concentrating.  Client Treatment Preferences: counseling every 2 weeks.  No medication at this time.     Healthcare consumer's goal for treatment:   Counselor, Jan Fireman, [facility] will support the patient's ability to achieve the goals identified. Cognitive Behavioral Therapy, Assertive Communication/Conflict Resolution Training, Relaxation Training, ACT, Humanistic and other evidenced-based practices will be used to promote progress towards healthy functioning.    Healthcare consumer will: Actively participate in therapy, working towards healthy functioning.     *Justification for Continuation/Discontinuation of Goal: R=Revised, O=Ongoing, A=Achieved, D=Discontinued  Goal 1) Increased awareness of connection of thoughts, feelings, behaviors to assist in challenging and reframing to improve coping w/ stressors per pt reports/therapist observation. Baseline date  11/06/22: Progress towards goal 0; How Often - Daily Target Date Goal Was reviewed Status Code Progress towards goal/Likert rating  11/07/23                            Goal 2) Increase expression of feelings and awareness of how stressors impacting mood AEB pt reports/therapist observation.  Baseline date 11/06/22: Progress towards goal 0; How Often - Daily Target Date Goal Was reviewed Status Code Progress towards goal  11/07/23                            Goal 3) increased effective coping skills and self care to manage and decrease symptoms of  anxiety and depression.  Baseline date 11/06/22: Progress towards goal 0; How Often - Daily Target Date Goal Was reviewed Status Code Progress towards goal  11/07/23                            This plan has been reviewed and created by the following participants:  This plan will be reviewed at least every 12 months. Date Behavioral Health Clinician Date Guardian/Patient   11/06/22 Jan Fireman, [facility]              11/06/22 Verbal Consent Provided                           Jan Fireman, [facility]

## 2023-01-01 ENCOUNTER — Ambulatory Visit (INDEPENDENT_AMBULATORY_CARE_PROVIDER_SITE_OTHER): Payer: BC Managed Care – PPO | Admitting: Psychology

## 2023-01-01 DIAGNOSIS — F411 Generalized anxiety disorder: Secondary | ICD-10-CM | POA: Diagnosis not present

## 2023-01-01 DIAGNOSIS — F331 Major depressive disorder, recurrent, moderate: Secondary | ICD-10-CM | POA: Diagnosis not present

## 2023-01-01 NOTE — Progress Notes (Signed)
[place] Counselor/Therapist Progress Note  Patient ID: Vanessa Contreras, MRN: [id number],    Date: 01/01/2023  Time Spent: 11:02am-11:57am     Treatment Type: Individual Therapy  Pt is seen for a virtual video visit via caregility.  Pt joins from her home, reporting privacy, and counselor from her home office.   Reported Symptoms: some sadness and guilt over weekend   Mental Status Exam: Appearance:  Well Groomed     Behavior: Appropriate  Motor: Normal  Speech/Language:  Normal Rate  Affect: Appropriate  Mood: sad  Thought process: normal  Thought content:   WNL  Sensory/Perceptual disturbances:   WNL  Orientation: oriented to person, place, time/date, and situation  Attention: Good  Concentration: Good  Memory: WNL  Fund of knowledge:  Good  Insight:   Good  Judgment:  Good  Impulse Control: Good   Risk Assessment: Danger to Self:  No Self-injurious Behavior: No Danger to Others: No Duty to Warn:no Physical Aggression / Violence:Yes  Access to Firearms a concern: No  Gang Involvement:No   Subjective: counselor assessed pt current functioning per pt report.  Processed w/pt emotions and recent interactions.  Validated and normalized pt emotions and reiterated not responsibility for other emotions or resolving emotions for ex.  Discussed how regulating emotions and reframe distortions re: to guilt.  Explored boundaries setting.  How she was able to maintain and other options for asserting boundary if needed.  Discussed family interactions and ways taking space she needs and not getting in middle of parents relationship. Pt affect wnl.  Pt reported on sister getting puppy for training and how impacting family interactions.  Pt discussed conflict resolution typically by not further engaging.  Pt reported on mom asked dad to move out and pattern of dad's overly cooperative and pleasant response.  Pt reported not feeling as anxious about outcomes of this.  Pt  discussed steps taking towards her independence and individuating from family.  Pt report ex started texts again this weekend- initially didn't view and put on mute.  Pt reported looked at next day and felt upset, sad, guilt for not responding.  Pt decided for no response and feels that right step for setting boundary.  Pt discussed want to not read messages next time and may state to not text further and won't respond again.  Pt reports keeping self busy w/ activities and engaging w/ friends.     Interventions: Cognitive Behavioral Therapy, Assertiveness/Communication, and Self care  Diagnosis:Generalized anxiety disorder  Major depressive disorder, recurrent episode, moderate (HCC)  Plan: Pt to f/u w/ in 1 week for counseling.  Pt to f/u as scheduled w/ PCP.   Individualized Treatment Plan Strengths: martial arts and working out.  Her pets.  Supports: mom and good friend from grad school    Goal/Needs for Treatment:  In order of importance to patient 1) increased awareness for self 2) coping skills 3) express emotions effectively    Client Statement of Needs:  "Know the what and why things (anxiety/depression) are happening so I can get through and help cope.  No longer stuff things down and think I will be ok.  I have found that helped to talk about things recently"    Treatment Level:outpatient counseling.    Symptoms:anxiety, on edge, poor sleep, fatigue, depressed mood, loss of interest.difficulty concentrating.  Client Treatment Preferences: counseling every 2 weeks.  No medication at this time.     Healthcare consumer's goal for treatment:   Counselor, [employer]  [facility] will support the patient's ability to achieve the goals identified. Cognitive Behavioral Therapy, Assertive Communication/Conflict Resolution Training, Relaxation Training, ACT, Humanistic and other evidenced-based practices will be used to promote progress towards healthy functioning.    Healthcare consumer  will: Actively participate in therapy, working towards healthy functioning.     *Justification for Continuation/Discontinuation of Goal: R=Revised, O=Ongoing, A=Achieved, D=Discontinued   Goal 1) Increased awareness of connection of thoughts, feelings, behaviors to assist in challenging and reframing to improve coping w/ stressors per pt reports/therapist observation. Baseline date 11/06/22: Progress towards goal 0; How Often - Daily Target Date Goal Was reviewed Status Code Progress towards goal/Likert rating  11/07/23                            Goal 2) Increase expression of feelings and awareness of how stressors impacting mood AEB pt reports/therapist observation.  Baseline date 11/06/22: Progress towards goal 0; How Often - Daily Target Date Goal Was reviewed Status Code Progress towards goal  11/07/23                            Goal 3) increased effective coping skills and self care to manage and decrease symptoms of  anxiety and depression.  Baseline date 11/06/22: Progress towards goal 0; How Often - Daily Target Date Goal Was reviewed Status Code Progress towards goal  11/07/23                            This plan has been reviewed and created by the following participants:  This plan will be reviewed at least every 12 months. Date Behavioral Health Clinician Date Guardian/Patient   11/06/22 Jan Fireman, [facility]              11/06/22 Verbal Consent Provided                            Jan Fireman, [facility]

## 2023-01-08 ENCOUNTER — Ambulatory Visit (INDEPENDENT_AMBULATORY_CARE_PROVIDER_SITE_OTHER): Payer: BC Managed Care – PPO | Admitting: Psychology

## 2023-01-08 DIAGNOSIS — F411 Generalized anxiety disorder: Secondary | ICD-10-CM | POA: Diagnosis not present

## 2023-01-08 DIAGNOSIS — F331 Major depressive disorder, recurrent, moderate: Secondary | ICD-10-CM

## 2023-01-08 NOTE — Progress Notes (Signed)
[place] Counselor/Therapist Progress Note  Patient ID: OCIA WIDEN, MRN: [id number],    Date: 01/08/2023  Time Spent: 10:00am-10:56am     Treatment Type: Individual Therapy  Pt is seen for a virtual video visit via caregility.  Pt joins from her home, reporting privacy, and counselor from her home office.   Reported Symptoms: continued increased clarity re: past relationship.  No emotional escalations  Mental Status Exam: Appearance:  Well Groomed     Behavior: Appropriate  Motor: Normal  Speech/Language:  Normal Rate  Affect: Appropriate  Mood: anxious  Thought process: normal  Thought content:   WNL  Sensory/Perceptual disturbances:   WNL  Orientation: oriented to person, place, time/date, and situation  Attention: Good  Concentration: Good  Memory: WNL  Fund of knowledge:  Good  Insight:   Good  Judgment:  Good  Impulse Control: Good   Risk Assessment: Danger to Self:  No Self-injurious Behavior: No Danger to Others: No Duty to Warn:no Physical Aggression / Violence:Yes  Access to Firearms a concern: No  Gang Involvement:No   Subjective: counselor assessed pt current functioning per pt report.  Processed w/pt emotions, positives and stressors.  Explored reaction to ex contacting mom.  Discussed relationship and what healthy vs. Unhealthy and recognizing what wanting in future relationship.  Discussed family dynamics and dad moving out and how lack of interaction on consistent w/ relationship. Explored pt considering moving out for self and decision making process on. Pt affect wnl.  Pt reported that dad is moving out this Friday and surprised that has come together this quick.  Pt discussed how no communication w/ dad on and that when did last time no contact w/ dad for the 3 months he was gone. Pt discussed how this is consistent w/ their relationship.  Pt reported on activities over past week and interactions w/ friends and family positive.  Pt reported  ex reached out to her mom to get together but didn't get emotional escalated about- did feel some annoyance about.  Pt discussed how recognized more how didn't have the emotional closeness that would want in relationship and how friendships developed over past year has helped her become aware of.     Interventions: Cognitive Behavioral Therapy, Assertiveness/Communication, and Self care  Diagnosis:Generalized anxiety disorder  Major depressive disorder, recurrent episode, moderate  Plan: Pt to f/u w/ in 1 week for counseling.  Pt to f/u as scheduled w/ PCP.   Individualized Treatment Plan Strengths: martial arts and working out.  Her pets.  Supports: mom and good friend from grad school    Goal/Needs for Treatment:  In order of importance to patient 1) increased awareness for self 2) coping skills 3) express emotions effectively    Client Statement of Needs:  "Know the what and why things (anxiety/depression) are happening so I can get through and help cope.  No longer stuff things down and think I will be ok.  I have found that helped to talk about things recently"    Treatment Level:outpatient counseling.    Symptoms:anxiety, on edge, poor sleep, fatigue, depressed mood, loss of interest.difficulty concentrating.  Client Treatment Preferences: counseling every 2 weeks.  No medication at this time.     Healthcare consumer's goal for treatment:   Counselor, Jan Fireman, [facility] will support the patient's ability to achieve the goals identified. Cognitive Behavioral Therapy, Assertive Communication/Conflict Resolution Training, Relaxation Training, ACT, Humanistic and other evidenced-based practices will be used to promote progress towards healthy functioning.  Healthcare consumer will: Actively participate in therapy, working towards healthy functioning.     *Justification for Continuation/Discontinuation of Goal: R=Revised, O=Ongoing, A=Achieved, D=Discontinued   Goal 1) Increased  awareness of connection of thoughts, feelings, behaviors to assist in challenging and reframing to improve coping w/ stressors per pt reports/therapist observation. Baseline date 11/06/22: Progress towards goal 0; How Often - Daily Target Date Goal Was reviewed Status Code Progress towards goal/Likert rating  11/07/23                            Goal 2) Increase expression of feelings and awareness of how stressors impacting mood AEB pt reports/therapist observation.  Baseline date 11/06/22: Progress towards goal 0; How Often - Daily Target Date Goal Was reviewed Status Code Progress towards goal  11/07/23                            Goal 3) increased effective coping skills and self care to manage and decrease symptoms of  anxiety and depression.  Baseline date 11/06/22: Progress towards goal 0; How Often - Daily Target Date Goal Was reviewed Status Code Progress towards goal  11/07/23                            This plan has been reviewed and created by the following participants:  This plan will be reviewed at least every 12 months. Date Behavioral Health Clinician Date Guardian/Patient   11/06/22 Jan Fireman, [facility]              11/06/22 Verbal Consent Provided                            Jan Fireman, [facility]

## 2023-01-13 ENCOUNTER — Ambulatory Visit (INDEPENDENT_AMBULATORY_CARE_PROVIDER_SITE_OTHER): Payer: BC Managed Care – PPO | Admitting: Psychology

## 2023-01-13 DIAGNOSIS — F411 Generalized anxiety disorder: Secondary | ICD-10-CM | POA: Diagnosis not present

## 2023-01-13 DIAGNOSIS — F331 Major depressive disorder, recurrent, moderate: Secondary | ICD-10-CM

## 2023-01-13 NOTE — Progress Notes (Signed)
[facility] Counselor/Therapist Progress Note  Patient ID: Vanessa Contreras, MRN: [id number],    Date: 01/13/2023  Time Spent: 11:02am-12:02pm     Treatment Type: Individual Therapy  Pt is seen for a virtual video visit via caregility.  Pt joins from her home, reporting privacy, and counselor from her home office.   Reported Symptoms: setting boundaries in relationships.   No emotional escalations.  Recognizing avoiding feelings.    Mental Status Exam: Appearance:  Well Groomed     Behavior: Appropriate  Motor: Normal  Speech/Language:  Normal Rate  Affect: Appropriate  Mood: anxious  Thought process: normal  Thought content:   WNL  Sensory/Perceptual disturbances:   WNL  Orientation: oriented to person, place, time/date, and situation  Attention: Good  Concentration: Good  Memory: WNL  Fund of knowledge:  Good  Insight:   Good  Judgment:  Good  Impulse Control: Good   Risk Assessment: Danger to Self:  No Self-injurious Behavior: No Danger to Others: No Duty to Warn:no Physical Aggression / Violence:Yes  Access to Firearms a concern: No  Gang Involvement:No   Subjective: counselor assessed pt current functioning per pt report.  Processed w/pt emotions, positives and stressors.  Explored dad leaving home, lack of interaction, assisted pt in recognizing emotions re: and avoiding emotions.  Discussed interactions w/ friends, discussed relationships and related emotions.  Provided psychoeducation re: boundaries. Pt affect wnl.  Pt reported that dad did move out Friday and as predicted dad's unhealthy pattern of behaviors in interactions w/ her mom.  Pt discussed how she has shut off from her feelings from dad's lack of interaction and discussed how intentional to not get hopes up for different outcome.  Pt is able to acknowledge some feelings of sadness.  Pt reported on interactions w/ friend group and w/ best friend.  Pt discussed emotional closeness feels w/ and  maintaining boundaries to maintain friendship.  Pt reported on some guilt and is able to reframe distortions.     Interventions: Cognitive Behavioral Therapy, Assertiveness/Communication, Psycho-education/Bibliotherapy, and Self care  Diagnosis:Generalized anxiety disorder  Major depressive disorder, recurrent episode, moderate  Plan: Pt to f/u w/ in 1 week for counseling.  Pt to f/u as scheduled w/ PCP.   Individualized Treatment Plan Strengths: martial arts and working out.  Her pets.  Supports: mom and good friend from grad school    Goal/Needs for Treatment:  In order of importance to patient 1) increased awareness for self 2) coping skills 3) express emotions effectively    Client Statement of Needs:  "Know the what and why things (anxiety/depression) are happening so I can get through and help cope.  No longer stuff things down and think I will be ok.  I have found that helped to talk about things recently"    Treatment Level:outpatient counseling.    Symptoms:anxiety, on edge, poor sleep, fatigue, depressed mood, loss of interest.difficulty concentrating.  Client Treatment Preferences: counseling every 2 weeks.  No medication at this time.     Healthcare consumer's goal for treatment:   Counselor, Forde Radon, [facility] will support the patient's ability to achieve the goals identified. Cognitive Behavioral Therapy, Assertive Communication/Conflict Resolution Training, Relaxation Training, ACT, Humanistic and other evidenced-based practices will be used to promote progress towards healthy functioning.    Healthcare consumer will: Actively participate in therapy, working towards healthy functioning.     *Justification for Continuation/Discontinuation of Goal: R=Revised, O=Ongoing, A=Achieved, D=Discontinued   Goal 1) Increased awareness of connection of thoughts,  feelings, behaviors to assist in challenging and reframing to improve coping w/ stressors per pt reports/therapist  observation. Baseline date 11/06/22: Progress towards goal 0; How Often - Daily Target Date Goal Was reviewed Status Code Progress towards goal/Likert rating  11/07/23                            Goal 2) Increase expression of feelings and awareness of how stressors impacting mood AEB pt reports/therapist observation.  Baseline date 11/06/22: Progress towards goal 0; How Often - Daily Target Date Goal Was reviewed Status Code Progress towards goal  11/07/23                            Goal 3) increased effective coping skills and self care to manage and decrease symptoms of  anxiety and depression.  Baseline date 11/06/22: Progress towards goal 0; How Often - Daily Target Date Goal Was reviewed Status Code Progress towards goal  11/07/23                            This plan has been reviewed and created by the following participants:  This plan will be reviewed at least every 12 months. Date Behavioral Health Clinician Date Guardian/Patient   11/06/22 Forde Radon, [facility]              11/06/22 Verbal Consent Provided                            Forde Radon [facility]               [place], [facility]

## 2023-01-18 ENCOUNTER — Telehealth: Payer: BC Managed Care – PPO | Admitting: Nurse Practitioner

## 2023-01-18 DIAGNOSIS — M545 Low back pain, unspecified: Secondary | ICD-10-CM

## 2023-01-18 DIAGNOSIS — N3000 Acute cystitis without hematuria: Secondary | ICD-10-CM

## 2023-01-18 MED ORDER — NITROFURANTOIN MONOHYD MACRO 100 MG PO CAPS: 100.0000 mg | ORAL_CAPSULE | Freq: Two times a day (BID) | ORAL | 0 refills | Status: DC

## 2023-01-18 NOTE — Progress Notes (Signed)
Based on what you shared with me it looks like you have uti symptoms with back pain,that should be evaluated in a face to face office visit. Due to the associating back pain you will need a urinalysis and urine culture for proper treatment. NOTE: There will be NO CHARGE for this eVisit   If you are having a true medical emergency please call 911.      For an urgent face to face visit, [place] has six urgent care centers for your convenience:     [facility] Health Urgent Care Center at [facility] Directions [phone] [address] [place], [place] [address]    [facility] Health Urgent Care Center [facility]) Get Driving Directions [phone] [address] [place], [place] [address]  [facility] Health Urgent Care Center [facility] [place]) Get Driving Directions [phone] [address] [place],  [place]  [address]  [facility] Health Urgent Care at [facility] Get Driving Directions [phone] [address] [place], [place] [address]   [facility] Health Urgent Care at [facility] Get Driving Directions  [phone] [address] [place], [place] [address]   [facility] Health Urgent Care at [facility] Directions [phone] [address], [address] [place], [place] [address]  Your MyChart E-visit questionnaire answers were reviewed by a board certified advanced clinical practitioner to complete your personal care plan based on your specific symptoms.  Thank you for using e-Visits.    Addenedum  E-Visit for Urinary Problems  We are sorry that you are not feeling well.  Here is how we plan to help!  Based on what you shared with me it looks like you most likely have a simple urinary tract infection.  A UTI (Urinary Tract Infection) is a bacterial infection of the bladder.  Most cases of urinary tract infections are simple to treat but a key part of your care is to encourage you to drink plenty  of fluids and watch your symptoms carefully.  I have prescribed MacroBid 100 mg twice a day for 5 days.  Your symptoms should gradually improve. Call us if the burning in your urine worsens, you develop worsening fever, back pain or pelvic pain or if your symptoms do not resolve after completing the antibiotic.  Urinary tract infections can be prevented by drinking plenty of water to keep your body hydrated.  Also be sure when you wipe, wipe from front to back and don't hold it in!  If possible, empty your bladder every 4 hours.  HOME CARE Drink plenty of fluids Compete the full course of the antibiotics even if the symptoms resolve Remember, when you need to go.go. Holding in your urine can increase the likelihood of getting a UTI! GET HELP RIGHT AWAY IF: You cannot urinate You get a high fever Worsening back pain occurs You see blood in your urine You feel sick to your stomach or throw up You feel like you are going to pass out  MAKE SURE YOU  Understand these instructions. Will watch your condition. Will get help right away if you are not doing well or get worse.   Thank you for choosing an e-visit.  Your e-visit answers were reviewed by a board certified advanced clinical practitioner to complete your personal care plan. Depending upon the condition, your plan could have included both over the counter or prescription medications.  Please review your pharmacy choice. Make sure the pharmacy is open so you can pick up prescription  now. If there is a problem, you may contact your provider through [employer] and have the prescription routed to another pharmacy.  Your safety is important to [place]. If you have drug allergies check your prescription carefully.   For the next 24 hours you can use MyChart to ask questions about today's visit, request a non-urgent call back, or ask for a work or school excuse. You will get an email in the next two days asking about your experience. I hope  that your e-visit has been valuable and will speed your recovery.  Mary-Margaret Daphine Deutscher, FNP   5-10 minutes spent reviewing and documenting in chart.

## 2023-01-22 ENCOUNTER — Ambulatory Visit (INDEPENDENT_AMBULATORY_CARE_PROVIDER_SITE_OTHER): Payer: BC Managed Care – PPO | Admitting: Psychology

## 2023-01-22 DIAGNOSIS — F331 Major depressive disorder, recurrent, moderate: Secondary | ICD-10-CM | POA: Diagnosis not present

## 2023-01-22 DIAGNOSIS — F411 Generalized anxiety disorder: Secondary | ICD-10-CM

## 2023-01-22 NOTE — Progress Notes (Signed)
[facility] Counselor/Therapist Progress Note  Patient ID: Vanessa Contreras, MRN: [id number],    Date: 01/22/2023  Time Spent: 11:01am-11:57am     Treatment Type: Individual Therapy  Pt is seen for a virtual video visit via caregility.  Pt joins from her home, reporting privacy, and counselor from her home office.   Reported Symptoms: setting boundaries in relationships.  Seeing patterns in her interactions/relationships.  Mental Status Exam: Appearance:  Well Groomed     Behavior: Appropriate  Motor: Normal  Speech/Language:  Normal Rate  Affect: Appropriate  Mood: anxious  Thought process: normal  Thought content:   WNL  Sensory/Perceptual disturbances:   WNL  Orientation: oriented to person, place, time/date, and situation  Attention: Good  Concentration: Good  Memory: WNL  Fund of knowledge:  Good  Insight:   Good  Judgment:  Good  Impulse Control: Good   Risk Assessment: Danger to Self:  No Self-injurious Behavior: No Danger to Others: No Duty to Warn:no Physical Aggression / Violence:Yes  Access to Firearms a concern: No  Gang Involvement:No   Subjective: counselor assessed pt current functioning per pt report.  Processed w/pt emotions and stressors.  Explored interactions w/ friends and family and patterns relationships wanting to fix other's distress.  Discussed ways to maintain boundaries and reflecting positives w/.  Encouraged self care and not avoiding her own needs.  Pt affect wnl.  Pt reported that she enjoyed volunteer work w/ school over weekend. Pt reported on positive interactions w/ family and friends.  Pt discussed concern for friend and his distress he is having and want to make better and acknowledging that can't.  Pt increased awareness that approach same w mom.  Pt did some self blame in interactions and acknowledged that she is keeping healthy boundaries.  Pt reports hasn't been feeling well past week- has UTI symptoms been tx w/ antibiotic  and had to f/u w/ walk in clinic as symptoms unresolved.  Pt acknowledges self care steps and not to avoid.     Interventions: Cognitive Behavioral Therapy, Assertiveness/Communication, Psycho-education/Bibliotherapy, and Self care  Diagnosis:Generalized anxiety disorder  Major depressive disorder, recurrent episode, moderate  Plan: Pt to f/u w/ in 1 week for counseling.  Pt to f/u as scheduled w/ PCP.   Individualized Treatment Plan Strengths: martial arts and working out.  Her pets.  Supports: mom and good friend from grad school    Goal/Needs for Treatment:  In order of importance to patient 1) increased awareness for self 2) coping skills 3) express emotions effectively    Client Statement of Needs:  "Know the what and why things (anxiety/depression) are happening so I can get through and help cope.  No longer stuff things down and think I will be ok.  I have found that helped to talk about things recently"    Treatment Level:outpatient counseling.    Symptoms:anxiety, on edge, poor sleep, fatigue, depressed mood, loss of interest.difficulty concentrating.  Client Treatment Preferences: counseling every 2 weeks.  No medication at this time.     Healthcare consumer's goal for treatment:   Counselor, Forde Radon, [facility] will support the patient's ability to achieve the goals identified. Cognitive Behavioral Therapy, Assertive Communication/Conflict Resolution Training, Relaxation Training, ACT, Humanistic and other evidenced-based practices will be used to promote progress towards healthy functioning.    Healthcare consumer will: Actively participate in therapy, working towards healthy functioning.     *Justification for Continuation/Discontinuation of Goal: R=Revised, O=Ongoing, A=Achieved, D=Discontinued   Goal 1) Increased  awareness of connection of thoughts, feelings, behaviors to assist in challenging and reframing to improve coping w/ stressors per pt reports/therapist  observation. Baseline date 11/06/22: Progress towards goal 0; How Often - Daily Target Date Goal Was reviewed Status Code Progress towards goal/Likert rating  11/07/23                            Goal 2) Increase expression of feelings and awareness of how stressors impacting mood AEB pt reports/therapist observation.  Baseline date 11/06/22: Progress towards goal 0; How Often - Daily Target Date Goal Was reviewed Status Code Progress towards goal  11/07/23                            Goal 3) increased effective coping skills and self care to manage and decrease symptoms of  anxiety and depression.  Baseline date 11/06/22: Progress towards goal 0; How Often - Daily Target Date Goal Was reviewed Status Code Progress towards goal  11/07/23                            This plan has been reviewed and created by the following participants:  This plan will be reviewed at least every 12 months. Date Behavioral Health Clinician Date Guardian/Patient   11/06/22 Forde Radon, [facility]              11/06/22 Verbal Consent Provided                           Forde Radon, [facility]

## 2023-01-24 ENCOUNTER — Ambulatory Visit (HOSPITAL_BASED_OUTPATIENT_CLINIC_OR_DEPARTMENT_OTHER)
Admission: RE | Admit: 2023-01-24 | Discharge: 2023-01-24 | Disposition: A | Discharge status: Home / Self Care | Payer: BC Managed Care – PPO | Source: Ambulatory Visit | Attending: Family Medicine | Admitting: Family Medicine

## 2023-01-24 ENCOUNTER — Telehealth: Payer: Self-pay | Admitting: Family Medicine

## 2023-01-24 ENCOUNTER — Encounter: Payer: Self-pay | Admitting: Family Medicine

## 2023-01-24 ENCOUNTER — Ambulatory Visit (INDEPENDENT_AMBULATORY_CARE_PROVIDER_SITE_OTHER): Payer: BC Managed Care – PPO | Admitting: Family Medicine

## 2023-01-24 VITALS — BP 102/64 | HR 56 | Temp 97.8°F | Wt 134.2 lb

## 2023-01-24 DIAGNOSIS — R1030 Lower abdominal pain, unspecified: Secondary | ICD-10-CM | POA: Diagnosis not present

## 2023-01-24 DIAGNOSIS — R1031 Right lower quadrant pain: Secondary | ICD-10-CM | POA: Diagnosis present

## 2023-01-24 LAB — COMPREHENSIVE METABOLIC PANEL
ALT: 10 U/L (ref 0–35)
AST: 18 U/L (ref 0–37)
Albumin: 4.2 g/dL (ref 3.5–5.2)
Alkaline Phosphatase: 53 U/L (ref 39–117)
BUN: 15 mg/dL (ref 6–23)
CO2: 23 mEq/L (ref 19–32)
Calcium: 9.3 mg/dL (ref 8.4–10.5)
Chloride: 104 mEq/L (ref 96–112)
Creatinine, Ser: 1.16 mg/dL (ref 0.40–1.20)
GFR: 65.93 mL/min (ref >60.00)
Glucose, Bld: 85 mg/dL (ref 70–99)
Potassium: 4.7 mEq/L (ref 3.5–5.1)
Sodium: 134 mEq/L — ABNORMAL LOW (ref 135–145)
Total Bilirubin: 0.4 mg/dL (ref 0.2–1.2)
Total Protein: 6.9 g/dL (ref 6.0–8.3)

## 2023-01-24 LAB — CBC WITH DIFFERENTIAL/PLATELET
Basophils Absolute: 0.1 10*3/uL (ref 0.0–0.1)
Basophils Relative: 1.2 % (ref 0.0–3.0)
Eosinophils Absolute: 0.1 10*3/uL (ref 0.0–0.7)
Eosinophils Relative: 3 % (ref 0.0–5.0)
HCT: 41.4 % (ref 36.0–46.0)
Hemoglobin: 13.9 g/dL (ref 12.0–15.0)
Lymphocytes Relative: 35.8 % (ref 12.0–46.0)
Lymphs Abs: 1.6 10*3/uL (ref 0.7–4.0)
MCHC: 33.6 g/dL (ref 30.0–36.0)
MCV: 92.6 fl (ref 78.0–100.0)
Monocytes Absolute: 0.4 10*3/uL (ref 0.1–1.0)
Monocytes Relative: 9.7 % (ref 3.0–12.0)
Neutro Abs: 2.2 10*3/uL (ref 1.4–7.7)
Neutrophils Relative %: 50.3 % (ref 43.0–77.0)
Platelets: 293 10*3/uL (ref 150.0–400.0)
RBC: 4.47 Mil/uL (ref 3.87–5.11)
RDW: 13.2 % (ref 11.5–15.5)
WBC: 4.4 10*3/uL (ref 4.0–10.5)

## 2023-01-24 LAB — POC URINALSYSI DIPSTICK (AUTOMATED)
Bilirubin, UA: NEGATIVE
Blood, UA: NEGATIVE
Glucose, UA: NEGATIVE
Ketones, UA: NEGATIVE
Leukocytes, UA: NEGATIVE
Nitrite, UA: NEGATIVE
Protein, UA: NEGATIVE
Spec Grav, UA: 1.015 (ref 1.010–1.025)
Urobilinogen, UA: 0.2 E.U./dL
pH, UA: 6 (ref 5.0–8.0)

## 2023-01-24 LAB — C-REACTIVE PROTEIN: CRP: <1 mg/dL (ref 0.5–20.0)

## 2023-01-24 MED ORDER — AMOXICILLIN-POT CLAVULANATE 875-125 MG PO TABS: 1.0000 | ORAL_TABLET | Freq: Two times a day (BID) | ORAL | 0 refills | Status: DC

## 2023-01-24 MED ORDER — IOHEXOL 300 MG/ML  SOLN
100.0000 mL | Freq: Once | INTRAMUSCULAR | Status: AC | PRN
  Administered 2023-01-24: 80 mL via INTRAVENOUS

## 2023-01-24 NOTE — Telephone Encounter (Signed)
Spoke with patient regarding results/recommendations.  

## 2023-01-24 NOTE — Telephone Encounter (Addendum)
Please call patient: Please inform patient her labs are normal which are reassuring, but not conclusive. Unfortunately, her stat CT is not going to be scheduled until 6:00 this evening.     If her CT results with an acute abnormality that requires additional treatment, she will need to go through the emergency room for care for if pain, fever or bowel changes occur-she will also need to go through the emergency room over the weekend.  If CT is normal, she is to continue the Augmentin over the weekend and follow-up on either Monday or Tuesday next week with this provider.

## 2023-01-24 NOTE — Addendum Note (Signed)
Addended by: Filomena Jungling on: 01/24/2023 01:19 PM   Modules accepted: Orders

## 2023-01-24 NOTE — Telephone Encounter (Signed)
err

## 2023-01-24 NOTE — Telephone Encounter (Signed)
LM for pt to return call to discuss.  

## 2023-01-24 NOTE — Patient Instructions (Signed)
No follow-ups on file.        Great to see you today.  I have refilled the medication(s) we provide.   If labs were collected, we will inform you of lab results once received either by echart message or telephone call.   - echart message- for normal results that have been seen by the patient already.   - telephone call: abnormal results or if patient has not viewed results in their echart.  

## 2023-01-24 NOTE — Progress Notes (Signed)
Vanessa Contreras , 1996-03-07, 27 y.o., female MRN: [id number] Patient Care Team    Relationship Specialty Notifications Start End  Natalia Leatherwood, DO PCP - General Family Medicine  10/09/20   Ellamae Sia, DO Consulting Physician Allergy  12/11/21   Chilton Greathouse, MD Consulting Physician Pulmonary Disease  12/11/21   Vivi Barrack, DPM Consulting Physician Podiatry  12/11/21   Christia Reading, MD Consulting Physician Otolaryngology  12/11/21     Chief Complaint  Patient presents with   Urinary Tract Infection    [facility] pain started new abx (bactrim) on wed. Consent discomfort, still has frequency and back pain     Subjective: Vanessa Contreras is a 27 y.o. Pt presents for an OV with complaints of lower abdominal pain of 1 week duration.  Associated symptoms include right and left lower quadrant achy and burning feeling.  She states its better in the morning but worse towards the evening hours.  She reports she has no problems with urination or burning with urination.  She has chronic urinary frequency.  She reports it can cause a little discomfort in her abdomen in the right lower quadrant after urination.  She denies fevers or chills.  She does report nausea.  She has some low back discomfort with this as well. E-Visit- no testing 01/18/2023 given macrobid, then [facility] 4/17 bactrim- new abx.  No culture report, but patient states she was told her urine was normal.  She states she does not feel any relief since starting either antibiotics.  She denies any bowel habit changes.  She denies any vaginal discharge.      01/24/2023    8:10 AM 12/13/2022    8:20 AM 12/11/2021    8:27 AM 12/06/2020    8:24 AM 10/09/2020   11:06 AM  Depression screen PHQ 2/9  Decreased Interest 0 0 0 0 0  Down, Depressed, Hopeless 0 0 0 0 0  PHQ - 2 Score 0 0 0 0 0  Altered sleeping 0 0     Tired, decreased energy 0 0     Change in appetite 0 0     Feeling bad or failure about  yourself  0 0     Trouble concentrating 0 0     Moving slowly or fidgety/restless 0 0     Suicidal thoughts 0 0     PHQ-9 Score 0 0       No Known Allergies Social History   Social History Narrative   Marital status/children/pets: Single   Education/employment: grad Pharmacist, hospital:      -Wears a bicycle helmet riding a bike: Yes     -smoke alarm in the home:Yes     - wears seatbelt: Yes     - Feels safe in their relationships: Yes   Past Medical History:  Diagnosis Date   Allergy    Asthma    exercise induced   Epidermal cyst of neck 03/25/2013   Migraines    Numbness on left side 05/11/2016   Past Surgical History:  Procedure Laterality Date   EAR CYST EXCISION Left 03/25/2013   Procedure: EXCISION OF A LEFT POSTERIOR AURICULAR CYST;  Surgeon: Wayland Denis, DO;  Location: [facility];  Service: Plastics;  Laterality: Left;   HAND SURGERY     giant cell tumor removed lt hand   Family History  Problem Relation Age  of Onset   Migraines Mother    Miscarriages / [place] Mother    Allergic rhinitis Mother    Allergic rhinitis Sister    Allergic rhinitis Brother    Depression Maternal Grandmother    Anxiety disorder Maternal Grandmother    Allergic rhinitis Sister    Allergic rhinitis Sister    Testicular cancer Father    Allergies as of 01/24/2023   No Known Allergies      Medication List        Accurate as of January 24, 2023 12:42 PM. If you have any questions, ask your nurse or doctor.          STOP taking these medications    nitrofurantoin (macrocrystal-monohydrate) 100 MG capsule Commonly known as: Macrobid Stopped by: Felix Pacini, DO       TAKE these medications    albuterol 108 (90 Base) MCG/ACT inhaler Commonly known as: VENTOLIN HFA Inhale 2 puffs into the lungs every 6 (six) hours as needed for wheezing.   amoxicillin-clavulanate 875-125 MG tablet Commonly known as: AUGMENTIN Take 1 tablet by mouth 2 (two) times  daily. Started by: Felix Pacini, DO   EPINEPHrine 0.3 mg/0.3 mL Soaj injection Commonly known as: Auvi-Q Inject 0.3 mg into the muscle as needed for anaphylaxis.   hydrOXYzine 10 MG tablet Commonly known as: ATARAX Take 1-3 tablets (10-30 mg total) by mouth daily as needed.   montelukast 10 MG tablet Commonly known as: Singulair Take 1 tablet (10 mg total) by mouth at bedtime.   rizatriptan 5 MG tablet Commonly known as: MAXALT Take 1 tablet (5 mg total) by mouth as needed for migraine. May repeat in 2 hours if needed        All past medical history, surgical history, allergies, family history, immunizations andmedications were updated in the EMR today and reviewed under the history and medication portions of their EMR.     Review of Systems  Constitutional:  Negative for chills and fever.  Gastrointestinal:  Negative for nausea and vomiting.  Genitourinary:  Positive for dysuria and frequency. Negative for flank pain and hematuria.   Negative, with the exception of above mentioned in HPI   Objective:  BP 102/64   Pulse (!) 56   Temp 97.8 F (36.6 C)   Wt 134 lb 3.2 oz (60.9 kg)   SpO2 100%   BMI 21.44 kg/m  Body mass index is 21.44 kg/m. Physical Exam Vitals and nursing note reviewed.  Constitutional:      General: She is not in acute distress.    Appearance: Normal appearance. She is normal weight. She is not ill-appearing or toxic-appearing.  HENT:     Head: Normocephalic and atraumatic.  Eyes:     General: No scleral icterus.       Right eye: No discharge.        Left eye: No discharge.     Extraocular Movements: Extraocular movements intact.     Conjunctiva/sclera: Conjunctivae normal.     Pupils: Pupils are equal, round, and reactive to light.  Abdominal:     General: Abdomen is flat. Bowel sounds are normal.     Palpations: Abdomen is soft.     Tenderness: There is abdominal tenderness (TTP- moderate RLQ pain, mold LLQ pain) in the right lower  quadrant. There is guarding and rebound (mild). There is no right CVA tenderness or left CVA tenderness. Positive signs include McBurney's sign. Negative signs include Murphy's sign, psoas sign and obturator sign.  Skin:    Findings: No rash.  Neurological:     Mental Status: She is alert and oriented to person, place, and time. Mental status is at baseline.     Motor: No weakness.     Coordination: Coordination normal.     Gait: Gait normal.  Psychiatric:        Mood and Affect: Mood normal.        Behavior: Behavior normal.        Thought Content: Thought content normal.        Judgment: Judgment normal.      No results found. No results found. No results found for this or any previous visit (from the past 24 hour(s)).  Assessment/Plan: DEBORAH LAZCANO is a 27 y.o. female present for OV for  Right lower quadrant abd pain:  - concern for appendicitis or colitis etiology.  - STAT: cbc, cmp, crp and CT abd ordered.  - start augmentin BID, stop other abx.  Would have elected to wait on results before starting antibiotic since she is afebrile and vital signs are stable, however since we are going into the weekend we will attempt to keep her out of the emergency room if able by covering covering infectious etiology. Patient understands if pain is worsening, fever, chills occurs or bowel habits change then she needs to go to the emergency room for evaluation. Follow-up dependent upon results  Reviewed expectations re: course of current medical issues. Discussed self-management of symptoms. Outlined signs and symptoms indicating need for more acute intervention. Patient verbalized understanding and all questions were answered. Patient received an After-Visit Summary.    Orders Placed This Encounter  Procedures   Urine Culture   CT Abdomen Pelvis W Contrast   CBC w/Diff   Comp Met (CMET)   C-reactive protein   POCT Urinalysis Dipstick (Automated)   Meds ordered this encounter   Medications   amoxicillin-clavulanate (AUGMENTIN) 875-125 MG tablet    Sig: Take 1 tablet by mouth 2 (two) times daily.    Dispense:  20 tablet    Refill:  0   Referral Orders  No referral(s) requested today     Note is dictated utilizing voice recognition software. Although note has been proof read prior to signing, occasional typographical errors still can be missed. If any questions arise, please do not hesitate to call for verification.   electronically signed by:  Felix Pacini, DO  [facility] [place]

## 2023-01-25 LAB — URINE CULTURE
MICRO NUMBER:: 14849227
SPECIMEN QUALITY:: ADEQUATE

## 2023-01-27 ENCOUNTER — Telehealth: Payer: Self-pay | Admitting: Family Medicine

## 2023-01-27 NOTE — Telephone Encounter (Signed)
Please call patient: She does not have appendicitis or colitis. She has a small cyst on her right ovary.  The cyst is about 2 cm, and is a cyst that occurs before the egg is released from the ovary midcycle.  Her lab work was all reassuring including her urine. She has moderate stool burden, which could be creating some discomfort.  I would encourage her to start a Senokot 1 tab before bed, this is over-the-counter.  This will help create adequate bowel movement.

## 2023-01-27 NOTE — Telephone Encounter (Signed)
Spoke with patient regarding results/recommendations.  

## 2023-01-29 ENCOUNTER — Ambulatory Visit (INDEPENDENT_AMBULATORY_CARE_PROVIDER_SITE_OTHER): Payer: BC Managed Care – PPO | Admitting: Psychology

## 2023-01-29 DIAGNOSIS — F331 Major depressive disorder, recurrent, moderate: Secondary | ICD-10-CM

## 2023-01-29 DIAGNOSIS — F411 Generalized anxiety disorder: Secondary | ICD-10-CM | POA: Diagnosis not present

## 2023-01-29 NOTE — Progress Notes (Signed)
[facility] Counselor/Therapist Progress Note  Patient ID: DI JASMER, MRN: [id number],    Date: 01/29/2023  Time Spent: 11:01am-11:56am     Treatment Type: Individual Therapy  Pt is seen for a virtual video visit via caregility.  Pt joins from her home, reporting privacy, and counselor from her home office.   Reported Symptoms: setting boundaries in relationships. Less anxiety this week.  Discussed difficult asserting w/ sister.  Mental Status Exam: Appearance:  Well Groomed     Behavior: Appropriate  Motor: Normal  Speech/Language:  Normal Rate  Affect: Appropriate  Mood: normal  Thought process: normal  Thought content:   WNL  Sensory/Perceptual disturbances:   WNL  Orientation: oriented to person, place, time/date, and situation  Attention: Good  Concentration: Good  Memory: WNL  Fund of knowledge:  Good  Insight:   Good  Judgment:  Good  Impulse Control: Good   Risk Assessment: Danger to Self:  No Self-injurious Behavior: No Danger to Others: No Duty to Warn:no Physical Aggression / Violence:Yes  Access to Firearms a concern: No  Gang Involvement:No   Subjective: counselor assessed pt current functioning per pt report.  Processed w/pt emotions, positives and stressors.  Discussed interactions w/ friends and family. Explored  setting boundaries w/ out avoidance and how to assert when communicating w/ sister.    Pt affect wnl.  Pt reported that her anxiety has been less this week.  Pt reported she didn't feel well the rest of last week and returned to PCP, but feeling better now.  Pt reports that keeping healthy boundaries in friendships.  Pt reported recent contact from dad that responding to but keeping boundaries w/. Pt reported on conflict w/ sister and how building irration w/ her over week w/ sister snapping at her.  Pt reported that in interaction over weekend when sister grabbed something from her became angry and did yell at her. Pt reports  not usually for her and recognized not coping w/ other interactions may have impacted her response.  Pt discussed want to avoid sister to prevent any further conflict but aware not be helpful approach in long run.  Pt considered ways to be assertive.       Interventions: Cognitive Behavioral Therapy, Assertiveness/Communication, Psycho-education/Bibliotherapy, and Self care  Diagnosis:Generalized anxiety disorder  Major depressive disorder, recurrent episode, moderate  Plan: Pt to f/u w/ in 1 week for counseling.  Pt to f/u as scheduled w/ PCP.   Individualized Treatment Plan Strengths: martial arts and working out.  Her pets.  Supports: mom and good friend from grad school    Goal/Needs for Treatment:  In order of importance to patient 1) increased awareness for self 2) coping skills 3) express emotions effectively    Client Statement of Needs:  "Know the what and why things (anxiety/depression) are happening so I can get through and help cope.  No longer stuff things down and think I will be ok.  I have found that helped to talk about things recently"    Treatment Level:outpatient counseling.    Symptoms:anxiety, on edge, poor sleep, fatigue, depressed mood, loss of interest.difficulty concentrating.  Client Treatment Preferences: counseling every 2 weeks.  No medication at this time.     Healthcare consumer's goal for treatment:   Counselor, Forde Radon, [facility] will support the patient's ability to achieve the goals identified. Cognitive Behavioral Therapy, Assertive Communication/Conflict Resolution Training, Relaxation Training, ACT, Humanistic and other evidenced-based practices will be used to promote  progress towards healthy functioning.    Healthcare consumer will: Actively participate in therapy, working towards healthy functioning.     *Justification for Continuation/Discontinuation of Goal: R=Revised, O=Ongoing, A=Achieved, D=Discontinued   Goal 1) Increased awareness of  connection of thoughts, feelings, behaviors to assist in challenging and reframing to improve coping w/ stressors per pt reports/therapist observation. Baseline date 11/06/22: Progress towards goal 0; How Often - Daily Target Date Goal Was reviewed Status Code Progress towards goal/Likert rating  11/07/23                            Goal 2) Increase expression of feelings and awareness of how stressors impacting mood AEB pt reports/therapist observation.  Baseline date 11/06/22: Progress towards goal 0; How Often - Daily Target Date Goal Was reviewed Status Code Progress towards goal  11/07/23                            Goal 3) increased effective coping skills and self care to manage and decrease symptoms of  anxiety and depression.  Baseline date 11/06/22: Progress towards goal 0; How Often - Daily Target Date Goal Was reviewed Status Code Progress towards goal  11/07/23                            This plan has been reviewed and created by the following participants:  This plan will be reviewed at least every 12 months. Date Behavioral Health Clinician Date Guardian/Patient   11/06/22 Forde Radon, [facility]              11/06/22 Verbal Consent Provided                          Forde Radon, [facility]

## 2023-02-10 ENCOUNTER — Ambulatory Visit (INDEPENDENT_AMBULATORY_CARE_PROVIDER_SITE_OTHER): Payer: BC Managed Care – PPO | Admitting: Psychology

## 2023-02-10 DIAGNOSIS — F330 Major depressive disorder, recurrent, mild: Secondary | ICD-10-CM | POA: Diagnosis not present

## 2023-02-10 DIAGNOSIS — F411 Generalized anxiety disorder: Secondary | ICD-10-CM

## 2023-02-10 NOTE — Progress Notes (Signed)
[facility] Counselor/Therapist Progress Note  Patient ID: Vanessa Contreras, MRN: [id number],    Date: 02/10/2023  Time Spent: 11:02am-12:02pm     Treatment Type: Individual Therapy  Pt is seen for a virtual video visit via caregility.  Pt joins from her home, reporting privacy, and counselor from her home office.   Reported Symptoms: pt reports decreased anxiety and depressive symptoms.  Pt reports conflict resolution w/ sister and pt reports supportive to friends.  Mental Status Exam: Appearance:  Well Groomed     Behavior: Appropriate  Motor: Normal  Speech/Language:  Normal Rate  Affect: Appropriate  Mood: normal  Thought process: normal  Thought content:   WNL  Sensory/Perceptual disturbances:   WNL  Orientation: oriented to person, place, time/date, and situation  Attention: Good  Concentration: Good  Memory: WNL  Fund of knowledge:  Good  Insight:   Good  Judgment:  Good  Impulse Control: Good   Risk Assessment: Danger to Self:  No Self-injurious Behavior: No Danger to Others: No Duty to Warn:no Physical Aggression / Violence:Yes  Access to Firearms a concern: No  Gang Involvement:No   Subjective: counselor assessed pt current functioning per pt report.  Processed w/pt emotions and interactions in relationship w/ friends and family.  Explored conflict resolution w/ sister and discussed ways of continuing to assert more.  Validated and normalized feelings of empathy w/ others hurt and how to keep good boundaries for self w/.   Pt affect wnl.  Pt reported that her anxiety and depressive symptoms have been less. Pt reported that things are better w/ interactions w/ sister and not having to avoid.  Pt reported on interactions w/ dad and not understanding his intentions/behaviors.  Pt reported on responding in polite ways to his attempts for communication but not putting energy and keeping boundaries for self.  Pt reported that friend is going through break up  recently and very emotional.  Pt discussed how hard knowing can't fix and keeping aware of how to support w/ out taking on problems.       Interventions: Cognitive Behavioral Therapy, Assertiveness/Communication, Psycho-education/Bibliotherapy, and Self care  Diagnosis:Generalized anxiety disorder  Mild episode of recurrent major depressive disorder (HCC)  Plan: Pt to f/u w/ in 1 week for counseling.  Pt to f/u as scheduled w/ PCP.   Individualized Treatment Plan Strengths: martial arts and working out.  Her pets.  Supports: mom and good friend from grad school    Goal/Needs for Treatment:  In order of importance to patient 1) increased awareness for self 2) coping skills 3) express emotions effectively    Client Statement of Needs:  "Know the what and why things (anxiety/depression) are happening so I can get through and help cope.  No longer stuff things down and think I will be ok.  I have found that helped to talk about things recently"    Treatment Level:outpatient counseling.    Symptoms:anxiety, on edge, poor sleep, fatigue, depressed mood, loss of interest.difficulty concentrating.  Client Treatment Preferences: counseling every 2 weeks.  No medication at this time.     Healthcare consumer's goal for treatment:   Counselor, Forde Radon, [facility] will support the patient's ability to achieve the goals identified. Cognitive Behavioral Therapy, Assertive Communication/Conflict Resolution Training, Relaxation Training, ACT, Humanistic and other evidenced-based practices will be used to promote progress towards healthy functioning.    Healthcare consumer will: Actively participate in therapy, working towards healthy functioning.     *  Justification for Continuation/Discontinuation of Goal: R=Revised, O=Ongoing, A=Achieved, D=Discontinued   Goal 1) Increased awareness of connection of thoughts, feelings, behaviors to assist in challenging and reframing to improve coping w/ stressors  per pt reports/therapist observation. Baseline date 11/06/22: Progress towards goal 0; How Often - Daily Target Date Goal Was reviewed Status Code Progress towards goal/Likert rating  11/07/23                            Goal 2) Increase expression of feelings and awareness of how stressors impacting mood AEB pt reports/therapist observation.  Baseline date 11/06/22: Progress towards goal 0; How Often - Daily Target Date Goal Was reviewed Status Code Progress towards goal  11/07/23                            Goal 3) increased effective coping skills and self care to manage and decrease symptoms of  anxiety and depression.  Baseline date 11/06/22: Progress towards goal 0; How Often - Daily Target Date Goal Was reviewed Status Code Progress towards goal  11/07/23                            This plan has been reviewed and created by the following participants:  This plan will be reviewed at least every 12 months. Date Behavioral Health Clinician Date Guardian/Patient   11/06/22 Forde Radon, [facility]              11/06/22 Verbal Consent Provided                            Forde Radon, [facility]

## 2023-02-17 ENCOUNTER — Ambulatory Visit (INDEPENDENT_AMBULATORY_CARE_PROVIDER_SITE_OTHER): Payer: BC Managed Care – PPO | Admitting: Psychology

## 2023-02-17 DIAGNOSIS — F411 Generalized anxiety disorder: Secondary | ICD-10-CM | POA: Diagnosis not present

## 2023-02-17 DIAGNOSIS — F330 Major depressive disorder, recurrent, mild: Secondary | ICD-10-CM | POA: Diagnosis not present

## 2023-02-17 NOTE — Progress Notes (Signed)
[facility] Counselor/Therapist Progress Note  Patient ID: Vanessa Contreras, MRN: [id number],    Date: 02/17/2023  Time Spent: 9:57am-10:51pm     Treatment Type: Individual Therapy  Pt is seen for a virtual video visit via caregility.  Pt joins from her home, reporting privacy, and counselor from her home office.   Reported Symptoms: pt reports some increased anxiety/feeling on edge   Mental Status Exam: Appearance:  Well Groomed     Behavior: Appropriate  Motor: Normal  Speech/Language:  Normal Rate  Affect: Appropriate  Mood: anxious  Thought process: normal  Thought content:   WNL  Sensory/Perceptual disturbances:   WNL  Orientation: oriented to person, place, time/date, and situation  Attention: Good  Concentration: Good  Memory: WNL  Fund of knowledge:  Good  Insight:   Good  Judgment:  Good  Impulse Control: Good   Risk Assessment: Danger to Self:  No Self-injurious Behavior: No Danger to Others: No Duty to Warn:no Physical Aggression / Violence:Yes  Access to Firearms a concern: No  Gang Involvement:No   Subjective: counselor assessed pt current functioning per pt report.  Processed w/pt emotions and increased anxiety.  Reflected awareness of increased anxiety w/ stressors for loved ones.  Explored ways of approaching emotions w/ mindfulness and focus on emotional regulation for self.  Discussed differentiating her emotions from others and not internalizing. Pt affect wnl.  Pt reported some increased feeling on edge.  Pt increased awareness that other's emotions distress impacts her emotions.  Pt discussed ways of observing emotion and naming emotions w/out judgement and ways of grounding self.  Pt recognized some need for boundaries in relationships as well to assist.         Interventions: Cognitive Behavioral Therapy, Assertiveness/Communication, Psycho-education/Bibliotherapy, and Self care  Diagnosis:Generalized anxiety disorder  Mild episode  of recurrent major depressive disorder (HCC)  Plan: Pt to f/u w/ in 1 week for counseling.  Pt to f/u as scheduled w/ PCP.   Individualized Treatment Plan Strengths: martial arts and working out.  Her pets.  Supports: mom and good friend from grad school    Goal/Needs for Treatment:  In order of importance to patient 1) increased awareness for self 2) coping skills 3) express emotions effectively    Client Statement of Needs:  "Know the what and why things (anxiety/depression) are happening so I can get through and help cope.  No longer stuff things down and think I will be ok.  I have found that helped to talk about things recently"    Treatment Level:outpatient counseling.    Symptoms:anxiety, on edge, poor sleep, fatigue, depressed mood, loss of interest.difficulty concentrating.  Client Treatment Preferences: counseling every 2 weeks.  No medication at this time.     Healthcare consumer's goal for treatment:   Counselor, Forde Radon, [facility] will support the patient's ability to achieve the goals identified. Cognitive Behavioral Therapy, Assertive Communication/Conflict Resolution Training, Relaxation Training, ACT, Humanistic and other evidenced-based practices will be used to promote progress towards healthy functioning.    Healthcare consumer will: Actively participate in therapy, working towards healthy functioning.     *Justification for Continuation/Discontinuation of Goal: R=Revised, O=Ongoing, A=Achieved, D=Discontinued   Goal 1) Increased awareness of connection of thoughts, feelings, behaviors to assist in challenging and reframing to improve coping w/ stressors per pt reports/therapist observation. Baseline date 11/06/22: Progress towards goal 0; How Often - Daily Target Date Goal Was reviewed Status Code Progress towards goal/Likert rating  11/07/23  Goal 2) Increase expression of feelings and awareness of how stressors impacting mood AEB pt  reports/therapist observation.  Baseline date 11/06/22: Progress towards goal 0; How Often - Daily Target Date Goal Was reviewed Status Code Progress towards goal  11/07/23                            Goal 3) increased effective coping skills and self care to manage and decrease symptoms of  anxiety and depression.  Baseline date 11/06/22: Progress towards goal 0; How Often - Daily Target Date Goal Was reviewed Status Code Progress towards goal  11/07/23                            This plan has been reviewed and created by the following participants:  This plan will be reviewed at least every 12 months. Date Behavioral Health Clinician Date Guardian/Patient   11/06/22 Forde Radon, [facility]              11/06/22 Verbal Consent Provided                          Forde Radon, [facility]

## 2023-02-24 ENCOUNTER — Ambulatory Visit (INDEPENDENT_AMBULATORY_CARE_PROVIDER_SITE_OTHER): Payer: BC Managed Care – PPO | Admitting: Psychology

## 2023-02-24 DIAGNOSIS — F411 Generalized anxiety disorder: Secondary | ICD-10-CM | POA: Diagnosis not present

## 2023-02-24 DIAGNOSIS — F330 Major depressive disorder, recurrent, mild: Secondary | ICD-10-CM | POA: Diagnosis not present

## 2023-02-24 NOTE — Progress Notes (Signed)
[facility] Counselor/Therapist Progress Note  Patient ID: Vanessa Contreras, MRN: [id number],    Date: 02/24/2023  Time Spent: 2:30pm-3:28pm     Treatment Type: Individual Therapy  Pt is seen for a virtual video visit via caregility.  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   Reported Symptoms: pt reports mood less anxious.  Identifying emotions.    Mental Status Exam: Appearance:  Well Groomed     Behavior: Appropriate  Motor: Normal  Speech/Language:  Normal Rate  Affect: Appropriate  Mood: anxious  Thought process: normal  Thought content:   WNL  Sensory/Perceptual disturbances:   WNL  Orientation: oriented to person, place, time/date, and situation  Attention: Good  Concentration: Good  Memory: WNL  Fund of knowledge:  Good  Insight:   Good  Judgment:  Good  Impulse Control: Good   Risk Assessment: Danger to Self:  No Self-injurious Behavior: No Danger to Others: No Duty to Warn:no Physical Aggression / Violence:Yes  Access to Firearms a concern: No  Gang Involvement:No   Subjective: counselor assessed pt current functioning per pt report.  Processed w/pt emotions and interactions.  Reflected awareness of own emotions and naming and validating emotions. Explored ways pt is offering support to friends/family and keeping healthy boundaries.   Pt affect wnl.  Pt reported anxiety decreased.  Pt reported on positive interactions w/ family/friends.  Pt reports some worry for friend- but keeping aware of support as friend and keeping boundaries.  Discussed interaction w/ dad how awkward and feeling of sad and mad about. Pt able to validate her emotions.         Interventions: Cognitive Behavioral Therapy, Assertiveness/Communication, Psycho-education/Bibliotherapy, and Self care  Diagnosis:Generalized anxiety disorder  Mild episode of recurrent major depressive disorder (HCC)  Plan: Pt to f/u w/ in 1 week for  counseling.  Pt to f/u as scheduled w/ PCP.   Individualized Treatment Plan Strengths: martial arts and working out.  Her pets.  Supports: mom and good friend from grad school    Goal/Needs for Treatment:  In order of importance to patient 1) increased awareness for self 2) coping skills 3) express emotions effectively    Client Statement of Needs:  "Know the what and why things (anxiety/depression) are happening so I can get through and help cope.  No longer stuff things down and think I will be ok.  I have found that helped to talk about things recently"    Treatment Level:outpatient counseling.    Symptoms:anxiety, on edge, poor sleep, fatigue, depressed mood, loss of interest.difficulty concentrating.  Client Treatment Preferences: counseling every 2 weeks.  No medication at this time.     Healthcare consumer's goal for treatment:   Counselor, Forde Radon, [facility] will support the patient's ability to achieve the goals identified. Cognitive Behavioral Therapy, Assertive Communication/Conflict Resolution Training, Relaxation Training, ACT, Humanistic and other evidenced-based practices will be used to promote progress towards healthy functioning.    Healthcare consumer will: Actively participate in therapy, working towards healthy functioning.     *Justification for Continuation/Discontinuation of Goal: R=Revised, O=Ongoing, A=Achieved, D=Discontinued   Goal 1) Increased awareness of connection of thoughts, feelings, behaviors to assist in challenging and reframing to improve coping w/ stressors per pt reports/therapist observation. Baseline date 11/06/22: Progress towards goal 0; How Often - Daily Target Date Goal Was reviewed Status Code Progress towards goal/Likert rating  11/07/23  Goal 2) Increase expression of feelings and awareness of how stressors impacting mood AEB pt reports/therapist observation.  Baseline date 11/06/22: Progress towards goal 0;  How Often - Daily Target Date Goal Was reviewed Status Code Progress towards goal  11/07/23                            Goal 3) increased effective coping skills and self care to manage and decrease symptoms of  anxiety and depression.  Baseline date 11/06/22: Progress towards goal 0; How Often - Daily Target Date Goal Was reviewed Status Code Progress towards goal  11/07/23                            This plan has been reviewed and created by the following participants:  This plan will be reviewed at least every 12 months. Date Behavioral Health Clinician Date Guardian/Patient   11/06/22 Forde Radon, [facility]              11/06/22 Verbal Consent Provided                               Forde Radon, [facility]

## 2023-03-05 ENCOUNTER — Ambulatory Visit (INDEPENDENT_AMBULATORY_CARE_PROVIDER_SITE_OTHER): Payer: BC Managed Care – PPO | Admitting: Psychology

## 2023-03-05 DIAGNOSIS — F411 Generalized anxiety disorder: Secondary | ICD-10-CM | POA: Diagnosis not present

## 2023-03-05 DIAGNOSIS — F330 Major depressive disorder, recurrent, mild: Secondary | ICD-10-CM | POA: Diagnosis not present

## 2023-03-05 NOTE — Progress Notes (Signed)
[facility] Counselor/Therapist Progress Note  Patient ID: Vanessa Contreras, MRN: [id number],    Date: 03/05/2023  Time Spent: 10:02am-10:50am     Treatment Type: Individual Therapy  Pt is seen for a virtual video visit via caregility.  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   Reported Symptoms: pt reported overall less anxious.  Pt reported increased awareness of her needs and how to express.      Mental Status Exam: Appearance:  Well Groomed     Behavior: Appropriate  Motor: Normal  Speech/Language:  Normal Rate  Affect: Appropriate  Mood: anxious  Thought process: normal  Thought content:   WNL  Sensory/Perceptual disturbances:   WNL  Orientation: oriented to person, place, time/date, and situation  Attention: Good  Concentration: Good  Memory: WNL  Fund of knowledge:  Good  Insight:   Good  Judgment:  Good  Impulse Control: Good   Risk Assessment: Danger to Self:  No Self-injurious Behavior: No Danger to Others: No Duty to Warn:no Physical Aggression / Violence:Yes  Access to Firearms a concern: No  Gang Involvement:No   Subjective: counselor assessed pt current functioning per pt report.  Processed w/pt  interactions and emotional response.  Discussed awareness of anxiety reaction and how may be related to past interactions.  Explored pt wants in interactions and ways to be able to set boundaries needed.  Discussed upcoming plans and positives.  Pt affect wnl.  Pt reported overall decreased anxiety.  Pt reported that feeling less impacted by friends emotions he is experiencing and able to be support.  Pt reported feeling panic when heard mom yelling on phone recent and recognized that upsetting to her as had experienced some of these interaction in past between parents.  Pt discussed dad's recent invite and how doesn't want to go and awareness of how feels incongruent w/ their relationship and can decline  w/out being rude.  Pt able to reframe "should" statements.  Pt reported on some upcoming plans w/ friends for travel in July and August. .         Interventions: Cognitive Behavioral Therapy, Assertiveness/Communication, Psycho-education/Bibliotherapy, and Self care  Diagnosis:Generalized anxiety disorder  Mild episode of recurrent major depressive disorder (HCC)  Plan: Pt to f/u w/ in 1 week for counseling.  Pt to f/u as scheduled w/ PCP.   Individualized Treatment Plan Strengths: martial arts and working out.  Her pets.  Supports: mom and good friend from grad school    Goal/Needs for Treatment:  In order of importance to patient 1) increased awareness for self 2) coping skills 3) express emotions effectively    Client Statement of Needs:  "Know the what and why things (anxiety/depression) are happening so I can get through and help cope.  No longer stuff things down and think I will be ok.  I have found that helped to talk about things recently"    Treatment Level:outpatient counseling.    Symptoms:anxiety, on edge, poor sleep, fatigue, depressed mood, loss of interest.difficulty concentrating.  Client Treatment Preferences: counseling every 2 weeks.  No medication at this time.     Healthcare consumer's goal for treatment:   Counselor, Forde Radon, [facility] will support the patient's ability to achieve the goals identified. Cognitive Behavioral Therapy, Assertive Communication/Conflict Resolution Training, Relaxation Training, ACT, Humanistic and other evidenced-based practices will be used to promote progress towards healthy functioning.    Healthcare consumer will: Actively participate in  therapy, working towards healthy functioning.     *Justification for Continuation/Discontinuation of Goal: R=Revised, O=Ongoing, A=Achieved, D=Discontinued   Goal 1) Increased awareness of connection of thoughts, feelings, behaviors to assist in challenging and reframing to improve coping w/  stressors per pt reports/therapist observation. Baseline date 11/06/22: Progress towards goal 0; How Often - Daily Target Date Goal Was reviewed Status Code Progress towards goal/Likert rating  11/07/23                            Goal 2) Increase expression of feelings and awareness of how stressors impacting mood AEB pt reports/therapist observation.  Baseline date 11/06/22: Progress towards goal 0; How Often - Daily Target Date Goal Was reviewed Status Code Progress towards goal  11/07/23                            Goal 3) increased effective coping skills and self care to manage and decrease symptoms of  anxiety and depression.  Baseline date 11/06/22: Progress towards goal 0; How Often - Daily Target Date Goal Was reviewed Status Code Progress towards goal  11/07/23                            This plan has been reviewed and created by the following participants:  This plan will be reviewed at least every 12 months. Date Behavioral Health Clinician Date Guardian/Patient   11/06/22 Forde Radon, [facility]              11/06/22 Verbal Consent Provided                              Forde Radon, [facility]

## 2023-03-24 ENCOUNTER — Ambulatory Visit (INDEPENDENT_AMBULATORY_CARE_PROVIDER_SITE_OTHER): Payer: BC Managed Care – PPO | Admitting: Psychology

## 2023-03-24 DIAGNOSIS — F330 Major depressive disorder, recurrent, mild: Secondary | ICD-10-CM

## 2023-03-24 DIAGNOSIS — F411 Generalized anxiety disorder: Secondary | ICD-10-CM | POA: Diagnosis not present

## 2023-03-24 NOTE — Progress Notes (Signed)
[facility] Counselor/Therapist Progress Note  Patient ID: ALYCE CRESCENZO, MRN: [id number],    Date: 03/24/2023  Time Spent: 2:34pm-3:33pm     Treatment Type: Individual Therapy  Pt is seen for a virtual video visit via caregility. Pt consents to telehealth session and is aware of limitations of telehealth visits.  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   Reported Symptoms: pt reported stressors over weekend.  Pt reported on some anxiety and increased awareness and insight for self.    Mental Status Exam: Appearance:  Well Groomed     Behavior: Appropriate  Motor: Normal  Speech/Language:  Normal Rate  Affect: Appropriate  Mood: anxious  Thought process: normal  Thought content:   WNL  Sensory/Perceptual disturbances:   WNL  Orientation: oriented to person, place, time/date, and situation  Attention: Good  Concentration: Good  Memory: WNL  Fund of knowledge:  Good  Insight:   Good  Judgment:  Good  Impulse Control: Good   Risk Assessment: Danger to Self:  No Self-injurious Behavior: No Danger to Others: No Duty to Warn:no Physical Aggression / Violence:Yes  Access to Firearms a concern: No  Gang Involvement:No   Subjective: counselor assessed pt current functioning per pt report.  Processed w/pt emotions and awareness of patterns.  Explored interactions w/ family, friends and ex.  Discussed awareness of "should statements" and reframes.  Reflected positive boundaries w/ relationships.   Pt affect wnl.  Pt reported overall decreased anxiety.  Pt reported on stressors over past week and weekend w/ family's interactions, contact w/ dad and ex recent texting.  Pt discussed some guilt felt in some of the interactions/lack of and how related to "shoulds' and distortions.  Pt was able to reframe and acknowledge insight for self.  Pt discussed boundaries w/ dad and w/ friendship and how those are developing.         Interventions: Cognitive Behavioral Therapy, Assertiveness/Communication, Psycho-education/Bibliotherapy, and Self care  Diagnosis:Generalized anxiety disorder  Mild episode of recurrent major depressive disorder (HCC)  Plan: Pt to f/u w/ in 1 week for counseling.  Pt to f/u as scheduled w/ PCP.   Individualized Treatment Plan Strengths: martial arts and working out.  Her pets.  Supports: mom and good friend from grad school    Goal/Needs for Treatment:  In order of importance to patient 1) increased awareness for self 2) coping skills 3) express emotions effectively    Client Statement of Needs:  "Know the what and why things (anxiety/depression) are happening so I can get through and help cope.  No longer stuff things down and think I will be ok.  I have found that helped to talk about things recently"    Treatment Level:outpatient counseling.    Symptoms:anxiety, on edge, poor sleep, fatigue, depressed mood, loss of interest.difficulty concentrating.  Client Treatment Preferences: counseling every 2 weeks.  No medication at this time.     Healthcare consumer's goal for treatment:   Counselor, Forde Radon, [facility] will support the patient's ability to achieve the goals identified. Cognitive Behavioral Therapy, Assertive Communication/Conflict Resolution Training, Relaxation Training, ACT, Humanistic and other evidenced-based practices will be used to promote progress towards healthy functioning.    Healthcare consumer will: Actively participate in therapy, working towards healthy functioning.     *Justification for Continuation/Discontinuation of Goal: R=Revised, O=Ongoing, A=Achieved, D=Discontinued   Goal 1) Increased awareness of connection of thoughts, feelings, behaviors to assist in challenging  and reframing to improve coping w/ stressors per pt reports/therapist observation. Baseline date 11/06/22: Progress towards goal 0; How Often - Daily Target Date Goal Was reviewed  Status Code Progress towards goal/Likert rating  11/07/23                            Goal 2) Increase expression of feelings and awareness of how stressors impacting mood AEB pt reports/therapist observation.  Baseline date 11/06/22: Progress towards goal 0; How Often - Daily Target Date Goal Was reviewed Status Code Progress towards goal  11/07/23                            Goal 3) increased effective coping skills and self care to manage and decrease symptoms of  anxiety and depression.  Baseline date 11/06/22: Progress towards goal 0; How Often - Daily Target Date Goal Was reviewed Status Code Progress towards goal  11/07/23                            This plan has been reviewed and created by the following participants:  This plan will be reviewed at least every 12 months. Date Behavioral Health Clinician Date Guardian/Patient   11/06/22 Forde Radon, [facility]              11/06/22 Verbal Consent Provided                             Forde Radon, [facility]

## 2023-04-02 ENCOUNTER — Ambulatory Visit (INDEPENDENT_AMBULATORY_CARE_PROVIDER_SITE_OTHER): Payer: BC Managed Care – PPO | Admitting: Psychology

## 2023-04-02 DIAGNOSIS — F411 Generalized anxiety disorder: Secondary | ICD-10-CM | POA: Diagnosis not present

## 2023-04-02 DIAGNOSIS — F330 Major depressive disorder, recurrent, mild: Secondary | ICD-10-CM | POA: Diagnosis not present

## 2023-04-02 NOTE — Progress Notes (Signed)
[facility] Counselor/Therapist Progress Note  Patient ID: Vanessa Contreras, MRN: [id number],    Date: 04/02/2023  Time Spent: 10:01am-10:57am     Treatment Type: Individual Therapy  Pt is seen for a virtual video visit via caregility. Pt consents to telehealth session and is aware of limitations of telehealth visits.  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   Reported Symptoms: pt reported on positive interactions, looking forward to vacation and keeping boundaries w/ ex.  Mental Status Exam: Appearance:  Well Groomed     Behavior: Appropriate  Motor: Normal  Speech/Language:  Normal Rate  Affect: Appropriate  Mood: normal  Thought process: normal  Thought content:   WNL  Sensory/Perceptual disturbances:   WNL  Orientation: oriented to person, place, time/date, and situation  Attention: Good  Concentration: Good  Memory: WNL  Fund of knowledge:  Good  Insight:   Good  Judgment:  Good  Impulse Control: Good   Risk Assessment: Danger to Self:  No Self-injurious Behavior: No Danger to Others: No Duty to Warn:no Physical Aggression / Violence:Yes  Access to Firearms a concern: No  Gang Involvement:No   Subjective: counselor assessed pt current functioning per pt report.  Processed w/pt positives and stressors. Explored interactions w/ family, friends and ex.  Discussed boundaries she is maintaining and recognizing that ex is pushing boundaries and how that makes her feel.   Pt affect wnl.  Pt reported overall decreased anxiety and not feeling down.  Pt reported on positives w/ celebrating sister's birthday.  Pt reported on interactions w/ friend and being a support to.  Pt reported on ex seeming to increase presence around her at school.  Pt discussed feeling some discomfort w/ his attempts to engage.  Pt is able to recognize that feeling is valid as he is pushing boundaries.  Pt discussed ways she is continuing ot  assert and feeling ok with that.  Pt is looking forward to vacation w/ friends.     Interventions: Cognitive Behavioral Therapy, Assertiveness/Communication, and Self care  Diagnosis:Generalized anxiety disorder  Mild episode of recurrent major depressive disorder (HCC)  Plan: Pt to f/u w/ in 1 week for counseling.  Pt to f/u as scheduled w/ PCP.   Individualized Treatment Plan Strengths: martial arts and working out.  Her pets.  Supports: mom and good friend from grad school    Goal/Needs for Treatment:  In order of importance to patient 1) increased awareness for self 2) coping skills 3) express emotions effectively    Client Statement of Needs:  "Know the what and why things (anxiety/depression) are happening so I can get through and help cope.  No longer stuff things down and think I will be ok.  I have found that helped to talk about things recently"    Treatment Level:outpatient counseling.    Symptoms:anxiety, on edge, poor sleep, fatigue, depressed mood, loss of interest.difficulty concentrating.  Client Treatment Preferences: counseling every 2 weeks.  No medication at this time.     Healthcare consumer's goal for treatment:   Counselor, Forde Radon, [facility] will support the patient's ability to achieve the goals identified. Cognitive Behavioral Therapy, Assertive Communication/Conflict Resolution Training, Relaxation Training, ACT, Humanistic and other evidenced-based practices will be used to promote progress towards healthy functioning.    Healthcare consumer will: Actively participate in therapy, working towards healthy functioning.     *Justification for Continuation/Discontinuation of Goal: R=Revised, O=Ongoing, A=Achieved, D=Discontinued  Goal 1) Increased awareness of connection of thoughts, feelings, behaviors to assist in challenging and reframing to improve coping w/ stressors per pt reports/therapist observation. Baseline date 11/06/22: Progress towards goal 0;  How Often - Daily Target Date Goal Was reviewed Status Code Progress towards goal/Likert rating  11/07/23                            Goal 2) Increase expression of feelings and awareness of how stressors impacting mood AEB pt reports/therapist observation.  Baseline date 11/06/22: Progress towards goal 0; How Often - Daily Target Date Goal Was reviewed Status Code Progress towards goal  11/07/23                            Goal 3) increased effective coping skills and self care to manage and decrease symptoms of  anxiety and depression.  Baseline date 11/06/22: Progress towards goal 0; How Often - Daily Target Date Goal Was reviewed Status Code Progress towards goal  11/07/23                            This plan has been reviewed and created by the following participants:  This plan will be reviewed at least every 12 months. Date Behavioral Health Clinician Date Guardian/Patient   11/06/22 Forde Radon, [facility]              11/06/22 Verbal Consent Provided                             Forde Radon [facility]               [place], [facility]

## 2023-04-09 ENCOUNTER — Ambulatory Visit (INDEPENDENT_AMBULATORY_CARE_PROVIDER_SITE_OTHER): Payer: BC Managed Care – PPO | Admitting: Psychology

## 2023-04-09 DIAGNOSIS — F411 Generalized anxiety disorder: Secondary | ICD-10-CM

## 2023-04-09 DIAGNOSIS — F330 Major depressive disorder, recurrent, mild: Secondary | ICD-10-CM | POA: Diagnosis not present

## 2023-04-09 NOTE — Progress Notes (Signed)
[facility] Counselor/Therapist Progress Note  Patient ID: Vanessa Contreras, MRN: [id number],    Date: 04/09/2023  Time Spent: 10:00am-10:45am     Treatment Type: Individual Therapy  Pt is seen for a virtual video visit via caregility. Pt consents to telehealth session and is aware of limitations of telehealth visits.  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   Reported Symptoms: pt reported mood good, not depressed or anxious.  Pt reports feeling that coping w/ stressors well.   Mental Status Exam: Appearance:  Well Groomed     Behavior: Appropriate  Motor: Normal  Speech/Language:  Normal Rate  Affect: Appropriate  Mood: normal  Thought process: normal  Thought content:   WNL  Sensory/Perceptual disturbances:   WNL  Orientation: oriented to person, place, time/date, and situation  Attention: Good  Concentration: Good  Memory: WNL  Fund of knowledge:  Good  Insight:   Good  Judgment:  Good  Impulse Control: Good   Risk Assessment: Danger to Self:  No Self-injurious Behavior: No Danger to Others: No Duty to Warn:no Physical Aggression / Violence:Yes  Access to Firearms a concern: No  Gang Involvement:No   Subjective: counselor assessed pt current functioning per pt report.  Processed w/pt mood and coping. Explored positive interactions and how following through w/ setting boundaries.  Reflected pt reports of improvements w/ coping.   Pt affect wnl.  Pt reported no depressed mood and not feeling anxious.  Pt reported on positive family and friend interactions.  Pt reports that did get another text from ex- but didn't create anxiety.  Pt is not responding and has thought w/how will respond to him if questions in person.  Pt reports looking forward to her trip w/ friends.  Pt discussed feeling that coping has improved for self and recognizing norm to have range of emotions.      Interventions: Cognitive Behavioral  Therapy, Assertiveness/Communication, and Self care  Diagnosis:Generalized anxiety disorder  Mild episode of recurrent major depressive disorder (HCC)  Plan: Pt to f/u w/ in 2 week for counseling.  Pt to f/u as scheduled w/ PCP.   Individualized Treatment Plan Strengths: martial arts and working out.  Her pets.  Supports: mom and good friend from grad school    Goal/Needs for Treatment:  In order of importance to patient 1) increased awareness for self 2) coping skills 3) express emotions effectively    Client Statement of Needs:  "Know the what and why things (anxiety/depression) are happening so I can get through and help cope.  No longer stuff things down and think I will be ok.  I have found that helped to talk about things recently"    Treatment Level:outpatient counseling.    Symptoms:anxiety, on edge, poor sleep, fatigue, depressed mood, loss of interest.difficulty concentrating.  Client Treatment Preferences: counseling every 2 weeks.  No medication at this time.     Healthcare consumer's goal for treatment:   Counselor, Forde Radon, [facility] will support the patient's ability to achieve the goals identified. Cognitive Behavioral Therapy, Assertive Communication/Conflict Resolution Training, Relaxation Training, ACT, Humanistic and other evidenced-based practices will be used to promote progress towards healthy functioning.    Healthcare consumer will: Actively participate in therapy, working towards healthy functioning.     *Justification for Continuation/Discontinuation of Goal: R=Revised, O=Ongoing, A=Achieved, D=Discontinued   Goal 1) Increased awareness of connection of thoughts, feelings, behaviors to assist in challenging and reframing to improve  coping w/ stressors per pt reports/therapist observation. Baseline date 11/06/22: Progress towards goal 0; How Often - Daily Target Date Goal Was reviewed Status Code Progress towards goal/Likert rating  11/07/23                             Goal 2) Increase expression of feelings and awareness of how stressors impacting mood AEB pt reports/therapist observation.  Baseline date 11/06/22: Progress towards goal 0; How Often - Daily Target Date Goal Was reviewed Status Code Progress towards goal  11/07/23                            Goal 3) increased effective coping skills and self care to manage and decrease symptoms of  anxiety and depression.  Baseline date 11/06/22: Progress towards goal 0; How Often - Daily Target Date Goal Was reviewed Status Code Progress towards goal  11/07/23                            This plan has been reviewed and created by the following participants:  This plan will be reviewed at least every 12 months. Date Behavioral Health Clinician Date Guardian/Patient   11/06/22 Forde Radon, [facility]              11/06/22 Verbal Consent Provided                          Forde Radon, [facility]

## 2023-04-23 ENCOUNTER — Ambulatory Visit (INDEPENDENT_AMBULATORY_CARE_PROVIDER_SITE_OTHER): Payer: BC Managed Care – PPO | Admitting: Psychology

## 2023-04-23 DIAGNOSIS — F411 Generalized anxiety disorder: Secondary | ICD-10-CM | POA: Diagnosis not present

## 2023-04-23 DIAGNOSIS — F3341 Major depressive disorder, recurrent, in partial remission: Secondary | ICD-10-CM | POA: Diagnosis not present

## 2023-04-23 NOTE — Progress Notes (Signed)
[facility] Counselor/Therapist Progress Note  Patient ID: Vanessa Contreras, MRN: [id number],    Date: 04/23/2023  Time Spent: 10:01am-10:51am     Treatment Type: Individual Therapy  Pt is seen for a virtual video visit via caregility. Pt consents to telehealth session and is aware of limitations of telehealth visits.  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   Reported Symptoms: pt reported mood good, not depressed or anxious.  Pt reports improving coping w/ stressors.   Mental Status Exam: Appearance:  Well Groomed     Behavior: Appropriate  Motor: Normal  Speech/Language:  Normal Rate  Affect: Appropriate  Mood: normal  Thought process: normal  Thought content:   WNL  Sensory/Perceptual disturbances:   WNL  Orientation: oriented to person, place, time/date, and situation  Attention: Good  Concentration: Good  Memory: WNL  Fund of knowledge:  Good  Insight:   Good  Judgment:  Good  Impulse Control: Good   Risk Assessment: Danger to Self:  No Self-injurious Behavior: No Danger to Others: No Duty to Warn:no Physical Aggression / Violence:Yes  Access to Firearms a concern: No  Gang Involvement:No   Subjective: counselor assessed pt current functioning per pt report.  Processed w/pt positives and stressors. Explored recent vacation and interactions w/ friend and ex and how setting boundaries.  Reflected insights re: impact of relationship w/ dad.   Pt affect wnl.  Pt reported no depressed mood and not feeling anxious.  Pt reported on growth w/ coping and mood improved.  Pt discussed positive of vacation w/ friends.  Pt reported on ex contacting mom and recognizing how inappropriate.  Pt discussed boundary setting and will assert not wanting any contact if continues.  Pt explored feeling of anger when dad texts and validated for self.  Pt discussed what wants and doesn't want and how to respond.  Pt discussed impact of  relationship w/ dad and how has impacted how she approaches relationships.     Interventions: Cognitive Behavioral Therapy, Assertiveness/Communication, and Self care  Diagnosis:Generalized anxiety disorder  Recurrent major depressive disorder, in partial remission (HCC)  Plan: Pt to f/u w/ in 2 weeks for counseling, then every 3 weeks.  Pt to f/u as scheduled w/ PCP.   Individualized Treatment Plan Strengths: martial arts and working out.  Her pets.  Supports: mom and good friend from grad school    Goal/Needs for Treatment:  In order of importance to patient 1) increased awareness for self 2) coping skills 3) express emotions effectively    Client Statement of Needs:  "Know the what and why things (anxiety/depression) are happening so I can get through and help cope.  No longer stuff things down and think I will be ok.  I have found that helped to talk about things recently"    Treatment Level:outpatient counseling.    Symptoms:anxiety, on edge, poor sleep, fatigue, depressed mood, loss of interest.difficulty concentrating.  Client Treatment Preferences: counseling every 2 weeks.  No medication at this time.     Healthcare consumer's goal for treatment:   Counselor, Forde Radon, [facility] will support the patient's ability to achieve the goals identified. Cognitive Behavioral Therapy, Assertive Communication/Conflict Resolution Training, Relaxation Training, ACT, Humanistic and other evidenced-based practices will be used to promote progress towards healthy functioning.    Healthcare consumer will: Actively participate in therapy, working towards healthy functioning.     *Justification for Continuation/Discontinuation of Goal: R=Revised, O=Ongoing, A=Achieved,  D=Discontinued   Goal 1) Increased awareness of connection of thoughts, feelings, behaviors to assist in challenging and reframing to improve coping w/ stressors per pt reports/therapist observation. Baseline date 11/06/22:  Progress towards goal 0; How Often - Daily Target Date Goal Was reviewed Status Code Progress towards goal/Likert rating  11/07/23                            Goal 2) Increase expression of feelings and awareness of how stressors impacting mood AEB pt reports/therapist observation.  Baseline date 11/06/22: Progress towards goal 0; How Often - Daily Target Date Goal Was reviewed Status Code Progress towards goal  11/07/23                            Goal 3) increased effective coping skills and self care to manage and decrease symptoms of  anxiety and depression.  Baseline date 11/06/22: Progress towards goal 0; How Often - Daily Target Date Goal Was reviewed Status Code Progress towards goal  11/07/23                            This plan has been reviewed and created by the following participants:  This plan will be reviewed at least every 12 months. Date Behavioral Health Clinician Date Guardian/Patient   11/06/22 Forde Radon, [facility]              11/06/22 Verbal Consent Provided                           Forde Radon, [facility]

## 2023-05-07 ENCOUNTER — Ambulatory Visit: Payer: BC Managed Care – PPO | Admitting: Psychology

## 2023-05-28 ENCOUNTER — Ambulatory Visit (INDEPENDENT_AMBULATORY_CARE_PROVIDER_SITE_OTHER): Payer: BC Managed Care – PPO | Admitting: Psychology

## 2023-05-28 DIAGNOSIS — F3341 Major depressive disorder, recurrent, in partial remission: Secondary | ICD-10-CM

## 2023-05-28 DIAGNOSIS — F411 Generalized anxiety disorder: Secondary | ICD-10-CM

## 2023-05-28 NOTE — Progress Notes (Signed)
[facility] Counselor/Therapist Progress Note  Patient ID: KRISANNE ARAI, MRN: [id number],    Date: 05/28/2023  Time Spent: 11:00am-11:56am     Treatment Type: Individual Therapy  Pt is seen for a virtual video visit via caregility. Pt consents to telehealth session and is aware of limitations of telehealth visits.  Pt joins from her friend's apartment, reporting privacy, and counselor from her home office.   Reported Symptoms: pt reported good travels and positive interactions w/ friends.  Pt reported stressors w/ her sister  Mental Status Exam: Appearance:  Well Groomed     Behavior: Appropriate  Motor: Normal  Speech/Language:  Normal Rate  Affect: Appropriate  Mood: anxious  Thought process: normal  Thought content:   WNL  Sensory/Perceptual disturbances:   WNL  Orientation: oriented to person, place, time/date, and situation  Attention: Good  Concentration: Good  Memory: WNL  Fund of knowledge:  Good  Insight:   Good  Judgment:  Good  Impulse Control: Good   Risk Assessment: Danger to Self:  No Self-injurious Behavior: No Danger to Others: No Duty to Warn:no Physical Aggression / Violence:Yes  Access to Firearms a concern: No  Gang Involvement:No   Subjective: counselor assessed pt current functioning per pt report.  Processed w/pt recent stressors in family.  Validated feelings re: and discussed ways she is coping and boundaries for self.   Explored recent travel for conference and roadtrip w/ friend.  Discussed upcoming semester.  Pt affect wnl.  Pt reported having a good conference and positive roadtrip w/ friend although tiring itinerary.  Pt reported that while gone some "drama" in her family some w/ dad typical interactions and then sister's decision that resulted in serious consequences.  Pt discussed how upset and sad for circumstances.  Pt dicussed worry for sister and glad she is getting help.  Pt was able to express her feelings and identify  boundaries for self and not taking on responsibility for sister.     Interventions: Cognitive Behavioral Therapy, Assertiveness/Communication, and Self care  Diagnosis:Generalized anxiety disorder  Recurrent major depressive disorder, in partial remission (HCC)  Plan: Pt to f/u w/ in 3 weeks for counseling.  Pt to f/u as scheduled w/ PCP.   Individualized Treatment Plan Strengths: martial arts and working out.  Her pets.  Supports: mom and good friend from grad school    Goal/Needs for Treatment:  In order of importance to patient 1) increased awareness for self 2) coping skills 3) express emotions effectively    Client Statement of Needs:  "Know the what and why things (anxiety/depression) are happening so I can get through and help cope.  No longer stuff things down and think I will be ok.  I have found that helped to talk about things recently"    Treatment Level:outpatient counseling.    Symptoms:anxiety, on edge, poor sleep, fatigue, depressed mood, loss of interest.difficulty concentrating.  Client Treatment Preferences: counseling every 2 weeks.  No medication at this time.     Healthcare consumer's goal for treatment:   Counselor, Forde Radon, [facility] will support the patient's ability to achieve the goals identified. Cognitive Behavioral Therapy, Assertive Communication/Conflict Resolution Training, Relaxation Training, ACT, Humanistic and other evidenced-based practices will be used to promote progress towards healthy functioning.    Healthcare consumer will: Actively participate in therapy, working towards healthy functioning.     *Justification for Continuation/Discontinuation of Goal: R=Revised, O=Ongoing, A=Achieved, D=Discontinued   Goal 1) Increased awareness of connection of  thoughts, feelings, behaviors to assist in challenging and reframing to improve coping w/ stressors per pt reports/therapist observation. Baseline date 11/06/22: Progress towards goal 0; How  Often - Daily Target Date Goal Was reviewed Status Code Progress towards goal/Likert rating  11/07/23                            Goal 2) Increase expression of feelings and awareness of how stressors impacting mood AEB pt reports/therapist observation.  Baseline date 11/06/22: Progress towards goal 0; How Often - Daily Target Date Goal Was reviewed Status Code Progress towards goal  11/07/23                            Goal 3) increased effective coping skills and self care to manage and decrease symptoms of  anxiety and depression.  Baseline date 11/06/22: Progress towards goal 0; How Often - Daily Target Date Goal Was reviewed Status Code Progress towards goal  11/07/23                            This plan has been reviewed and created by the following participants:  This plan will be reviewed at least every 12 months. Date Behavioral Health Clinician Date Guardian/Patient   11/06/22 Forde Radon, [facility]              11/06/22 Verbal Consent Provided                           Forde Radon [facility]     [place], [facility]

## 2023-06-18 ENCOUNTER — Ambulatory Visit (INDEPENDENT_AMBULATORY_CARE_PROVIDER_SITE_OTHER): Payer: BC Managed Care – PPO | Admitting: Psychology

## 2023-06-18 DIAGNOSIS — F3341 Major depressive disorder, recurrent, in partial remission: Secondary | ICD-10-CM | POA: Diagnosis not present

## 2023-06-18 DIAGNOSIS — F411 Generalized anxiety disorder: Secondary | ICD-10-CM | POA: Diagnosis not present

## 2023-06-18 NOTE — Progress Notes (Signed)
[facility] Counselor/Therapist Progress Note  Patient ID: Vanessa Contreras, MRN: [id number],    Date: 06/18/2023  Time Spent: 10:00am-10:45am     Treatment Type: Individual Therapy  Pt is seen for a virtual video visit via caregility. Pt consents to telehealth session and is aware of limitations of telehealth visits.  Pt joins from her home,  reporting privacy, and counselor from her home office.   Reported Symptoms: pt reported mood has been good, not anxious or down.  Pt reports she is struggling w/ daily migraines, brain fog, fatigue.    Mental Status Exam: Appearance:  Well Groomed     Behavior: Appropriate  Motor: Normal  Speech/Language:  Normal Rate  Affect: Appropriate  Mood: normal  Thought process: normal  Thought content:   WNL  Sensory/Perceptual disturbances:   WNL  Orientation: oriented to person, place, time/date, and situation  Attention: Good  Concentration: Good  Memory: WNL  Fund of knowledge:  Good  Insight:   Good  Judgment:  Good  Impulse Control: Good   Risk Assessment: Danger to Self:  No Self-injurious Behavior: No Danger to Others: No Duty to Warn:no Physical Aggression / Violence:Yes  Access to Firearms a concern: No  Gang Involvement:No   Subjective: counselor assessed pt current functioning per pt report.  Processed w/pt recent travel, positive and stressors.  Explored recent migraines and encouraged her f/u w/ PCP. Discussed some stressor w/ ex increased proximity and ways of coping and acknowledging her ability to respond w/ assertive boundaries. Pt affect wnl.  Pt reported her conference and symposium were positive- some stress but good.  Pt reports she has been struggling w/ daily headache/migraine for past 2 weeks, w/ fatigue and w/ brain fog.  Pt has scheduled w/ PCP to explore further.  Pt reports not feeling overwhelmed or over stressed.  Pt discussed some interactions w/ ex over past couple weeks- showing up to things in  department that wouldn't normally and putting in close proximity to her.  Pt reports does trigger her a little and was able decided how to respond and felt good about.   Pt feels that she could assert verbally w/ him further if needed. Pt reported on some things looking forward to and positive interactions w/ friends.      Interventions: Cognitive Behavioral Therapy, Assertiveness/Communication, and Self care  Diagnosis:Generalized anxiety disorder  Recurrent major depressive disorder, in partial remission (HCC)  Plan: Pt to f/u w/ in 3 weeks for counseling.  Pt to f/u as scheduled w/ PCP.   Individualized Treatment Plan Strengths: martial arts and working out.  Her pets.  Supports: mom and good friend from grad school    Goal/Needs for Treatment:  In order of importance to patient 1) increased awareness for self 2) coping skills 3) express emotions effectively    Client Statement of Needs:  "Know the what and why things (anxiety/depression) are happening so I can get through and help cope.  No longer stuff things down and think I will be ok.  I have found that helped to talk about things recently"    Treatment Level:outpatient counseling.    Symptoms:anxiety, on edge, poor sleep, fatigue, depressed mood, loss of interest.difficulty concentrating.  Client Treatment Preferences: counseling every 2 weeks.  No medication at this time.     Healthcare consumer's goal for treatment:   Counselor, Forde Radon, [facility] will support the patient's ability to achieve the goals identified. Cognitive Behavioral Therapy, Assertive Communication/Conflict Resolution Training,  Relaxation Training, ACT, Humanistic and other evidenced-based practices will be used to promote progress towards healthy functioning.    Healthcare consumer will: Actively participate in therapy, working towards healthy functioning.     *Justification for Continuation/Discontinuation of Goal: R=Revised, O=Ongoing, A=Achieved,  D=Discontinued   Goal 1) Increased awareness of connection of thoughts, feelings, behaviors to assist in challenging and reframing to improve coping w/ stressors per pt reports/therapist observation. Baseline date 11/06/22: Progress towards goal 0; How Often - Daily Target Date Goal Was reviewed Status Code Progress towards goal/Likert rating  11/07/23                            Goal 2) Increase expression of feelings and awareness of how stressors impacting mood AEB pt reports/therapist observation.  Baseline date 11/06/22: Progress towards goal 0; How Often - Daily Target Date Goal Was reviewed Status Code Progress towards goal  11/07/23                            Goal 3) increased effective coping skills and self care to manage and decrease symptoms of  anxiety and depression.  Baseline date 11/06/22: Progress towards goal 0; How Often - Daily Target Date Goal Was reviewed Status Code Progress towards goal  11/07/23                            This plan has been reviewed and created by the following participants:  This plan will be reviewed at least every 12 months. Date Behavioral Health Clinician Date Guardian/Patient   11/06/22 Forde Radon, [facility]              11/06/22 Verbal Consent Provided                       Forde Radon, [facility]

## 2023-06-26 ENCOUNTER — Ambulatory Visit: Payer: BC Managed Care – PPO | Admitting: Family Medicine

## 2023-06-26 ENCOUNTER — Encounter: Payer: Self-pay | Admitting: Family Medicine

## 2023-06-26 VITALS — BP 105/71 | HR 63 | Temp 98.0°F | Wt 131.2 lb

## 2023-06-26 DIAGNOSIS — Z23 Encounter for immunization: Secondary | ICD-10-CM | POA: Diagnosis not present

## 2023-06-26 DIAGNOSIS — R519 Headache, unspecified: Secondary | ICD-10-CM | POA: Insufficient documentation

## 2023-06-26 DIAGNOSIS — G43409 Hemiplegic migraine, not intractable, without status migrainosus: Secondary | ICD-10-CM

## 2023-06-26 LAB — COMPREHENSIVE METABOLIC PANEL
ALT: 13 U/L (ref 0–35)
AST: 19 U/L (ref 0–37)
Albumin: 4.4 g/dL (ref 3.5–5.2)
Alkaline Phosphatase: 54 U/L (ref 39–117)
BUN: 12 mg/dL (ref 6–23)
CO2: 26 mEq/L (ref 19–32)
Calcium: 9.8 mg/dL (ref 8.4–10.5)
Chloride: 105 mEq/L (ref 96–112)
Creatinine, Ser: 0.89 mg/dL (ref 0.40–1.20)
GFR: 90.34 mL/min (ref >60.00)
Glucose, Bld: 81 mg/dL (ref 70–99)
Potassium: 4.9 mEq/L (ref 3.5–5.1)
Sodium: 138 mEq/L (ref 135–145)
Total Bilirubin: 0.6 mg/dL (ref 0.2–1.2)
Total Protein: 7.4 g/dL (ref 6.0–8.3)

## 2023-06-26 LAB — IBC + FERRITIN
Ferritin: 25.1 ng/mL (ref 10.0–291.0)
Iron: 137 ug/dL (ref 42–145)
Saturation Ratios: 38.2 % (ref 20.0–50.0)
TIBC: 358.4 ug/dL (ref 250.0–450.0)
Transferrin: 256 mg/dL (ref 212.0–360.0)

## 2023-06-26 LAB — CBC WITH DIFFERENTIAL/PLATELET
Basophils Absolute: 0 10*3/uL (ref 0.0–0.1)
Basophils Relative: 1 % (ref 0.0–3.0)
Eosinophils Absolute: 0.1 10*3/uL (ref 0.0–0.7)
Eosinophils Relative: 2.6 % (ref 0.0–5.0)
HCT: 46 % (ref 36.0–46.0)
Hemoglobin: 15.1 g/dL — ABNORMAL HIGH (ref 12.0–15.0)
Lymphocytes Relative: 31.8 % (ref 12.0–46.0)
Lymphs Abs: 1.7 10*3/uL (ref 0.7–4.0)
MCHC: 32.8 g/dL (ref 30.0–36.0)
MCV: 92.6 fl (ref 78.0–100.0)
Monocytes Absolute: 0.5 10*3/uL (ref 0.1–1.0)
Monocytes Relative: 9.3 % (ref 3.0–12.0)
Neutro Abs: 2.9 10*3/uL (ref 1.4–7.7)
Neutrophils Relative %: 55.3 % (ref 43.0–77.0)
Platelets: 308 10*3/uL (ref 150.0–400.0)
RBC: 4.97 Mil/uL (ref 3.87–5.11)
RDW: 13 % (ref 11.5–15.5)
WBC: 5.2 10*3/uL (ref 4.0–10.5)

## 2023-06-26 LAB — MAGNESIUM: Magnesium: 2 mg/dL (ref 1.5–2.5)

## 2023-06-26 LAB — TSH: TSH: 1.33 u[IU]/mL (ref 0.35–5.50)

## 2023-06-26 LAB — B12 AND FOLATE PANEL
Folate: >24.2 ng/mL (ref >5.9)
Vitamin B-12: 888 pg/mL (ref 211–911)

## 2023-06-26 LAB — SEDIMENTATION RATE: Sed Rate: 3 mm/hr (ref 0–20)

## 2023-06-26 NOTE — Progress Notes (Signed)
Vanessa Contreras , 11-25-1995, 27 y.o., female MRN: [id number] Patient Care Team    Relationship Specialty Notifications Start End  Vanessa Leatherwood, DO PCP - General Family Medicine  10/09/20   Vanessa Sia, DO Consulting Physician Allergy  12/11/21   Vanessa Greathouse, MD Consulting Physician Pulmonary Disease  12/11/21   Vanessa Contreras, DPM Consulting Physician Podiatry  12/11/21   Vanessa Reading, MD Consulting Physician Otolaryngology  12/11/21     Chief Complaint  Patient presents with   Migraine    2wks; no current migraine     Subjective: Vanessa Contreras is a 27 y.o. Pt presents for an OV to discuss her migraines.  Patient reports she had a rather significant episode of a headache that started 06/03/2023 lasted for 8 days.  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  She had neurological evaluation the onset of her migraines that she reports was normal. She states this has been very well-controlled with the added magnesium and B12 supplement using Maxalt for abortive therapy when migraines do occur.  She reports August 27 with probably the most severe headache she has had in the past.  She was traveling during that time and admits she did have decreased sleep that week.  She also had COVID the week prior.  She endorses having visual disturbance in the right eye that lasted about half hour and right arm numbness tingling that lasted approximately 15 minutes.  She was using Maxalt to help with the headache over those 8 days and she states that it would work and then headache would return approximately 24 hours later.  After 8 days of the headache, it did resolve, but she has had more frequent headaches intermittently since that time.  She endorses increased fatigue.  She does not have a headache today.  She denies any continued visual disturbances.     01/24/2023    8:10 AM 12/13/2022    8:20 AM 12/11/2021    8:27 AM 12/06/2020    8:24 AM 10/09/2020   11:06 AM  Depression screen PHQ 2/9   Decreased Interest 0 0 0 0 0  Down, Depressed, Hopeless 0 0 0 0 0  PHQ - 2 Score 0 0 0 0 0  Altered sleeping 0 0     Tired, decreased energy 0 0     Change in appetite 0 0     Feeling bad or failure about yourself  0 0     Trouble concentrating 0 0     Moving slowly or fidgety/restless 0 0     Suicidal thoughts 0 0     PHQ-9 Score 0 0       No Known Allergies Social History   Social History Narrative   Marital status/children/pets: Single   Education/employment: grad Pharmacist, hospital:      -Wears a bicycle helmet riding a bike: Yes     -smoke alarm in the home:Yes     - wears seatbelt: Yes     - Feels safe in their relationships: Yes   Past Medical History:  Diagnosis Date   Allergy    Asthma    exercise induced   Epidermal cyst of neck 03/25/2013   Migraines    Numbness on left side 05/11/2016   Past Surgical History:  Procedure Laterality Date   EAR CYST EXCISION Left 03/25/2013   Procedure: EXCISION OF A LEFT POSTERIOR AURICULAR CYST;  Surgeon: Wayland Denis,  DO;  Location: [facility];  Service: Plastics;  Laterality: Left;   HAND SURGERY     giant cell tumor removed lt hand   Family History  Problem Relation Age of Onset   Migraines Mother    Miscarriages / [place] Mother    Allergic rhinitis Mother    Allergic rhinitis Sister    Allergic rhinitis Brother    Depression Maternal Grandmother    Anxiety disorder Maternal Grandmother    Allergic rhinitis Sister    Allergic rhinitis Sister    Testicular cancer Father    Allergies as of 06/26/2023   No Known Allergies      Medication List        Accurate as of June 26, 2023 11:54 AM. If you have any questions, ask your nurse or doctor.          STOP taking these medications    amoxicillin-clavulanate 875-125 MG tablet Commonly known as: AUGMENTIN Stopped by: Vanessa Contreras       TAKE these medications    albuterol 108 (90 Base) MCG/ACT inhaler Commonly known as:  VENTOLIN HFA Inhale 2 puffs into the lungs every 6 (six) hours as needed for wheezing.   EPINEPHrine 0.3 mg/0.3 mL Soaj injection Commonly known as: Auvi-Q Inject 0.3 mg into the muscle as needed for anaphylaxis.   hydrOXYzine 10 MG tablet Commonly known as: ATARAX Take 1-3 tablets (10-30 mg total) by mouth daily as needed.   montelukast 10 MG tablet Commonly known as: Singulair Take 1 tablet (10 mg total) by mouth at bedtime.   rizatriptan 5 MG tablet Commonly known as: MAXALT Take 1 tablet (5 mg total) by mouth as needed for migraine. May repeat in 2 hours if needed        All past medical history, surgical history, allergies, family history, immunizations andmedications were updated in the EMR today and reviewed under the history and medication portions of their EMR.     ROS Negative, with the exception of above mentioned in HPI   Objective:  BP 105/71   Pulse 63   Temp 98 F (36.7 C)   Wt 131 lb 3.2 oz (59.5 kg)   LMP 06/05/2023   SpO2 100%   BMI 20.96 kg/m  Body mass index is 20.96 kg/m. Physical Exam Vitals and nursing note reviewed.  Constitutional:      General: She is not in acute distress.    Appearance: Normal appearance. She is normal weight. She is not ill-appearing or toxic-appearing.  HENT:     Head: Normocephalic and atraumatic.  Eyes:     General: Vision grossly intact. Gaze aligned appropriately. No scleral icterus.       Right eye: No discharge.        Left eye: No discharge.     Extraocular Movements: Extraocular movements intact.     Conjunctiva/sclera: Conjunctivae normal.     Pupils: Pupils are equal, round, and reactive to light.  Skin:    Findings: No rash.  Neurological:     General: No focal deficit present.     Mental Status: She is alert and oriented to person, place, and time. Mental status is at baseline.     Cranial Nerves: Cranial nerves 2-12 are intact.     Motor: Motor function is intact. No weakness.     Coordination:  Coordination is intact. Coordination normal.     Gait: Gait is intact. Gait normal.  Psychiatric:        Mood  and Affect: Mood normal.        Behavior: Behavior normal.        Thought Content: Thought content normal.        Judgment: Judgment normal.      No results found. No results found. No results found for this or any previous visit (from the past 24 hour(s)).  Assessment/Plan: Vanessa Contreras is a 27 y.o. female present for OV for  Need for influenza vaccination - Flu vaccine trivalent PF, 6mos and older(Flulaval,Afluria,Fluarix,Fluzone)  Hemiplegic migraine without status migrainosus, not intractable/Worsening headaches Patient does not have a headache today.  Headache could have been triggered secondary to decreased sleep/travel. We discussed returning to neurology for further evaluation and patient would like to wait on this for now.  She had neurology evaluation at the onset of her migraines, which she states was normal.  She understands if headaches start to occur more frequently again and more severe, then we would want to move forward with neurology and MRI of the brain.  She reports understanding. We will start with lab work today to ensure she is not anemic, iron deficient, monitor B12 magnesium and electrolytes. She will continue her magnesium and B12 supplement which has worked well for her in the past and improved her headache condition. Continue Maxalt abortive therapy. - Comp Met (CMET) - Sedimentation rate - CBC w/Diff - B12 and Folate Panel - Magnesium - IBC + Ferritin - TSH  Reviewed expectations re: course of current medical issues. Discussed self-management of symptoms. Outlined signs and symptoms indicating need for more acute intervention. Patient verbalized understanding and all questions were answered. Patient received an After-Visit Summary.    Orders Placed This Encounter  Procedures   Flu vaccine trivalent PF, 6mos and  older(Flulaval,Afluria,Fluarix,Fluzone)   Comp Met (CMET)   Sedimentation rate   CBC w/Diff   B12 and Folate Panel   Magnesium   IBC + Ferritin   TSH   No orders of the defined types were placed in this encounter.  Referral Orders  No referral(s) requested today     Note is dictated utilizing voice recognition software. Although note has been proof read prior to signing, occasional typographical errors still can be missed. If any questions arise, please do not hesitate to call for verification.   electronically signed by:  Vanessa Pacini, DO  [facility] [place]

## 2023-06-26 NOTE — Patient Instructions (Addendum)

## 2023-07-02 ENCOUNTER — Ambulatory Visit: Payer: BC Managed Care – PPO | Admitting: Family Medicine

## 2023-07-16 ENCOUNTER — Ambulatory Visit (INDEPENDENT_AMBULATORY_CARE_PROVIDER_SITE_OTHER): Payer: BC Managed Care – PPO | Admitting: Psychology

## 2023-07-16 DIAGNOSIS — F3341 Major depressive disorder, recurrent, in partial remission: Secondary | ICD-10-CM | POA: Diagnosis not present

## 2023-07-16 DIAGNOSIS — F411 Generalized anxiety disorder: Secondary | ICD-10-CM

## 2023-07-16 NOTE — Progress Notes (Signed)
[facility] Counselor/Therapist Progress Note  Patient ID: Vanessa Contreras, MRN: [id number],    Date: 07/16/2023  Time Spent: 10:00am-10:53am     Treatment Type: Individual Therapy  Pt is seen for a virtual video visit via caregility. Pt consents to telehealth session and is aware of limitations of telehealth visits.  Pt joins from her home,  reporting privacy, and counselor from her home office.   Reported Symptoms: pt reported some anxiety and worry about family stressors.    Mental Status Exam: Appearance:  Well Groomed     Behavior: Appropriate  Motor: Normal  Speech/Language:  Normal Rate  Affect: Appropriate  Mood: anxious  Thought process: normal  Thought content:   WNL  Sensory/Perceptual disturbances:   WNL  Orientation: oriented to person, place, time/date, and situation  Attention: Good  Concentration: Good  Memory: WNL  Fund of knowledge:  Good  Insight:   Good  Judgment:  Good  Impulse Control: Good   Risk Assessment: Danger to Self:  No Self-injurious Behavior: No Danger to Others: No Duty to Warn:no Physical Aggression / Violence:Yes  Access to Firearms a concern: No  Gang Involvement:No   Subjective: counselor assessed pt current functioning per pt report.  Processed w/pt recent travel, positives and stressors.  Explored family stressors and concerns.  Discussed ways of acknowledging and naming emotions.  Pt affect wnl.  Pt reported her travel was good.  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  Pt reported relationship is going well and feels good to be in relationship that feels supported.  Pt discussed worries for her family w/ family stressors- sister mental health and mom's separation- interactions w/ her dad.  Pt discussed ways support and acknowledge healthy boundaries as well.  Pt discussed how often unaware of feelings until more intense and receptive to identify and naming emotions.       Interventions:  Cognitive Behavioral Therapy, Assertiveness/Communication, and Self care  Diagnosis:Generalized anxiety disorder  Recurrent major depressive disorder, in partial remission (HCC)  Plan: Pt to f/u w/ in 2 weeks for counseling.  Pt to f/u as scheduled w/ PCP.   Individualized Treatment Plan Strengths: martial arts and working out.  Her pets.  Supports: mom and good friend from grad school    Goal/Needs for Treatment:  In order of importance to patient 1) increased awareness for self 2) coping skills 3) express emotions effectively    Client Statement of Needs:  "Know the what and why things (anxiety/depression) are happening so I can get through and help cope.  No longer stuff things down and think I will be ok.  I have found that helped to talk about things recently"    Treatment Level:outpatient counseling.    Symptoms:anxiety, on edge, poor sleep, fatigue, depressed mood, loss of interest.difficulty concentrating.  Client Treatment Preferences: counseling every 2 weeks.  No medication at this time.     Healthcare consumer's goal for treatment:   Counselor, Forde Radon, [facility] will support the patient's ability to achieve the goals identified. Cognitive Behavioral Therapy, Assertive Communication/Conflict Resolution Training, Relaxation Training, ACT, Humanistic and other evidenced-based practices will be used to promote progress towards healthy functioning.    Healthcare consumer will: Actively participate in therapy, working towards healthy functioning.     *Justification for Continuation/Discontinuation of Goal: R=Revised, O=Ongoing, A=Achieved, D=Discontinued   Goal 1) Increased awareness of connection of thoughts, feelings, behaviors to assist in challenging and reframing to improve coping w/  stressors per pt reports/therapist observation. Baseline date 11/06/22: Progress towards goal 0; How Often - Daily Target Date Goal Was reviewed Status Code Progress towards goal/Likert  rating  11/07/23                            Goal 2) Increase expression of feelings and awareness of how stressors impacting mood AEB pt reports/therapist observation.  Baseline date 11/06/22: Progress towards goal 0; How Often - Daily Target Date Goal Was reviewed Status Code Progress towards goal  11/07/23                            Goal 3) increased effective coping skills and self care to manage and decrease symptoms of  anxiety and depression.  Baseline date 11/06/22: Progress towards goal 0; How Often - Daily Target Date Goal Was reviewed Status Code Progress towards goal  11/07/23                            This plan has been reviewed and created by the following participants:  This plan will be reviewed at least every 12 months. Date Behavioral Health Clinician Date Guardian/Patient   11/06/22 Forde Radon, [facility]              11/06/22 Verbal Consent Provided                       Forde Radon [facility]               Cam[place], [facility]

## 2023-07-17 ENCOUNTER — Telehealth: Payer: BC Managed Care – PPO | Admitting: Physician Assistant

## 2023-07-17 DIAGNOSIS — B3731 Acute candidiasis of vulva and vagina: Secondary | ICD-10-CM | POA: Diagnosis not present

## 2023-07-17 MED ORDER — FLUCONAZOLE 150 MG PO TABS
150.0000 mg | ORAL_TABLET | Freq: Every day | ORAL | 0 refills | Status: DC
Start: 2023-07-17 — End: 2023-07-31

## 2023-07-17 NOTE — Progress Notes (Signed)
I have spent 5 minutes in review of e-visit questionnaire, review and updating patient chart, medical decision making and response to patient.   Mia Milan Cody Jacklynn Dehaas, PA-C    

## 2023-07-17 NOTE — Progress Notes (Signed)

## 2023-07-29 ENCOUNTER — Ambulatory Visit (INDEPENDENT_AMBULATORY_CARE_PROVIDER_SITE_OTHER): Payer: BC Managed Care – PPO | Admitting: Psychology

## 2023-07-29 DIAGNOSIS — F411 Generalized anxiety disorder: Secondary | ICD-10-CM | POA: Diagnosis not present

## 2023-07-29 DIAGNOSIS — F3341 Major depressive disorder, recurrent, in partial remission: Secondary | ICD-10-CM

## 2023-07-29 NOTE — Progress Notes (Signed)
[facility] Counselor/Therapist Progress Note  Patient ID: Vanessa Contreras, MRN: [id number],    Date: 07/29/2023  Time Spent: 10:00am-10:53am     Treatment Type: Individual Therapy  Pt is seen for a virtual video visit via caregility. Pt consents to telehealth session and is aware of limitations of telehealth visits.  Pt joins from her home,  reporting privacy, and counselor from her home office.   Reported Symptoms: pt reports some worry about family stressors, pt reports 1 incident of ruminating and escalating emotions.    Mental Status Exam: Appearance:  Well Groomed     Behavior: Appropriate  Motor: Normal  Speech/Language:  Normal Rate  Affect: Appropriate  Mood: anxious   Thought process: normal  Thought content:   WNL  Sensory/Perceptual disturbances:   WNL  Orientation: oriented to person, place, time/date, and situation  Attention: Good  Concentration: Good  Memory: WNL  Fund of knowledge:  Good  Insight:   Good  Judgment:  Good  Impulse Control: Good   Risk Assessment: Danger to Self:  No Self-injurious Behavior: No Danger to Others: No Duty to Warn:no Physical Aggression / Violence:Yes  Access to Firearms a concern: No  Gang Involvement:No   Subjective: counselor assessed pt current functioning per pt report.  Processed w/pt recent positives, stressors and impact on mood.  Explored family stressors and validated sadness and worry.  Discussed ways of acknowledging and naming emotions experiencing and use of mindfulness skills to cope when ruminating.    Pt affect wnl.  Pt reported had fever for past week and fever has maintained unless taking fever reducer.  Pt did go to walk in clinic and stated just virus.  Pt reports had to cancel work and meeting last week and sleep has been poor.  Pt recognized impacting emotional tolerance and did have emotional escalation when ruminated on stressor that lead to spiral of negative thoughts.  Pt receptive to ways  of acknowledging feelings, focus on mindfulness for coping through.  Pt reports family stressors still present.  Pt feels that has healthy boundaries re:Marland Kitchen    Interventions: Cognitive Behavioral Therapy, Assertiveness/Communication, and Self care  Diagnosis:Generalized anxiety disorder  Recurrent major depressive disorder, in partial remission (HCC)  Plan: Pt to f/u w/ in 2 weeks for counseling.  Pt to f/u as scheduled w/ PCP.   Individualized Treatment Plan Strengths: martial arts and working out.  Her pets.  Supports: mom and good friend from grad school    Goal/Needs for Treatment:  In order of importance to patient 1) increased awareness for self 2) coping skills 3) express emotions effectively    Client Statement of Needs:  "Know the what and why things (anxiety/depression) are happening so I can get through and help cope.  No longer stuff things down and think I will be ok.  I have found that helped to talk about things recently"    Treatment Level:outpatient counseling.    Symptoms:anxiety, on edge, poor sleep, fatigue, depressed mood, loss of interest.difficulty concentrating.  Client Treatment Preferences: counseling every 2 weeks.  No medication at this time.     Healthcare consumer's goal for treatment:   Counselor, Forde Radon, [facility] will support the patient's ability to achieve the goals identified. Cognitive Behavioral Therapy, Assertive Communication/Conflict Resolution Training, Relaxation Training, ACT, Humanistic and other evidenced-based practices will be used to promote progress towards healthy functioning.    Healthcare consumer will: Actively participate in therapy, working towards healthy functioning.     *  Justification for Continuation/Discontinuation of Goal: R=Revised, O=Ongoing, A=Achieved, D=Discontinued   Goal 1) Increased awareness of connection of thoughts, feelings, behaviors to assist in challenging and reframing to improve coping w/ stressors per pt  reports/therapist observation. Baseline date 11/06/22: Progress towards goal 0; How Often - Daily Target Date Goal Was reviewed Status Code Progress towards goal/Likert rating  11/07/23                            Goal 2) Increase expression of feelings and awareness of how stressors impacting mood AEB pt reports/therapist observation.  Baseline date 11/06/22: Progress towards goal 0; How Often - Daily Target Date Goal Was reviewed Status Code Progress towards goal  11/07/23                            Goal 3) increased effective coping skills and self care to manage and decrease symptoms of  anxiety and depression.  Baseline date 11/06/22: Progress towards goal 0; How Often - Daily Target Date Goal Was reviewed Status Code Progress towards goal  11/07/23                            This plan has been reviewed and created by the following participants:  This plan will be reviewed at least every 12 months. Date Behavioral Health Clinician Date Guardian/Patient   11/06/22 Forde Radon, [facility]              11/06/22 Verbal Consent Provided                          Forde Radon, [facility]

## 2023-07-31 ENCOUNTER — Other Ambulatory Visit (HOSPITAL_COMMUNITY)
Admission: RE | Admit: 2023-07-31 | Discharge: 2023-07-31 | Disposition: A | Discharge status: Home / Self Care | Payer: BC Managed Care – PPO | Source: Ambulatory Visit | Attending: Family Medicine | Admitting: Family Medicine

## 2023-07-31 ENCOUNTER — Ambulatory Visit: Payer: BC Managed Care – PPO | Admitting: Family Medicine

## 2023-07-31 ENCOUNTER — Encounter: Payer: Self-pay | Admitting: Family Medicine

## 2023-07-31 VITALS — BP 92/58 | HR 80 | Temp 98.3°F | Wt 134.2 lb

## 2023-07-31 DIAGNOSIS — R509 Fever, unspecified: Secondary | ICD-10-CM | POA: Diagnosis present

## 2023-07-31 DIAGNOSIS — M2550 Pain in unspecified joint: Secondary | ICD-10-CM | POA: Diagnosis not present

## 2023-07-31 DIAGNOSIS — R1031 Right lower quadrant pain: Secondary | ICD-10-CM

## 2023-07-31 DIAGNOSIS — M25551 Pain in right hip: Secondary | ICD-10-CM | POA: Diagnosis not present

## 2023-07-31 LAB — SEDIMENTATION RATE: Sed Rate: 45 mm/h — ABNORMAL HIGH (ref 0–20)

## 2023-07-31 LAB — COMPREHENSIVE METABOLIC PANEL
ALT: 16 U/L (ref 0–35)
AST: 14 U/L (ref 0–37)
Albumin: 3.6 g/dL (ref 3.5–5.2)
Alkaline Phosphatase: 71 U/L (ref 39–117)
BUN: 9 mg/dL (ref 6–23)
CO2: 25 meq/L (ref 19–32)
Calcium: 9 mg/dL (ref 8.4–10.5)
Chloride: 105 meq/L (ref 96–112)
Creatinine, Ser: 0.73 mg/dL (ref 0.40–1.20)
GFR: 114.52 mL/min (ref >60.00)
Glucose, Bld: 88 mg/dL (ref 70–99)
Potassium: 4.2 meq/L (ref 3.5–5.1)
Sodium: 138 meq/L (ref 135–145)
Total Bilirubin: 0.4 mg/dL (ref 0.2–1.2)
Total Protein: 6.9 g/dL (ref 6.0–8.3)

## 2023-07-31 LAB — POC URINALSYSI DIPSTICK (AUTOMATED)
Bilirubin, UA: NEGATIVE
Glucose, UA: NEGATIVE
Ketones, UA: NEGATIVE
Leukocytes, UA: NEGATIVE
Nitrite, UA: NEGATIVE
Protein, UA: POSITIVE — AB
Spec Grav, UA: 1.02 (ref 1.010–1.025)
Urobilinogen, UA: 0.2 U/dL
pH, UA: 5.5 (ref 5.0–8.0)

## 2023-07-31 LAB — CBC WITH DIFFERENTIAL/PLATELET
Basophils Absolute: 0.1 10*3/uL (ref 0.0–0.1)
Basophils Relative: 0.8 % (ref 0.0–3.0)
Eosinophils Absolute: 0.2 10*3/uL (ref 0.0–0.7)
Eosinophils Relative: 1.7 % (ref 0.0–5.0)
HCT: 39.2 % (ref 36.0–46.0)
Hemoglobin: 12.8 g/dL (ref 12.0–15.0)
Lymphocytes Relative: 15.9 % (ref 12.0–46.0)
Lymphs Abs: 1.4 10*3/uL (ref 0.7–4.0)
MCHC: 32.5 g/dL (ref 30.0–36.0)
MCV: 93.5 fL (ref 78.0–100.0)
Monocytes Absolute: 0.8 10*3/uL (ref 0.1–1.0)
Monocytes Relative: 9 % (ref 3.0–12.0)
Neutro Abs: 6.6 10*3/uL (ref 1.4–7.7)
Neutrophils Relative %: 72.6 % (ref 43.0–77.0)
Platelets: 352 10*3/uL (ref 150.0–400.0)
RBC: 4.2 Mil/uL (ref 3.87–5.11)
RDW: 13.7 % (ref 11.5–15.5)
WBC: 9.1 10*3/uL (ref 4.0–10.5)

## 2023-07-31 LAB — C-REACTIVE PROTEIN: CRP: 18.5 mg/dL (ref 0.5–20.0)

## 2023-07-31 MED ORDER — AMOXICILLIN-POT CLAVULANATE 875-125 MG PO TABS: 1.0000 | ORAL_TABLET | Freq: Two times a day (BID) | ORAL | 0 refills | Status: DC

## 2023-07-31 MED ORDER — OMEPRAZOLE 20 MG PO CPDR: 20.0000 mg | DELAYED_RELEASE_CAPSULE | Freq: Every day | ORAL | 0 refills | Status: AC

## 2023-07-31 NOTE — Patient Instructions (Addendum)
Return in about 11 days (around 08/11/2023), or if symptoms worsen or fail to improve, for RLQ pain.        Great to see you today.  I have refilled the medication(s) we provide.   If labs were collected or images ordered, we will inform you of  results once we have received them and reviewed. We will contact you either by echart message, or telephone call.  Please give ample time to the testing facility, and our office to run,  receive and review results. Please do not call inquiring of results, even if you can see them in your chart. We will contact you as soon as we are able. If it has been over 1 week since the test was completed, and you have not yet heard from [place], then please call us.    - echart message- for normal results that have been seen by the patient already.   - telephone call: abnormal results or if patient has not viewed results in their echart.  If a referral to a specialist was entered for you, please call us in 2 weeks if you have not heard from the specialist office to schedule.

## 2023-07-31 NOTE — Progress Notes (Signed)
Vanessa Contreras , 1996/01/25, 27 y.o., female MRN: [id number] Patient Care Team    Relationship Specialty Notifications Start End  Vanessa Leatherwood, DO PCP - General Family Medicine  10/09/20   Vanessa Sia, DO Consulting Physician Allergy  12/11/21   Vanessa Greathouse, MD Consulting Physician Pulmonary Disease  12/11/21   Vanessa Contreras, DPM Consulting Physician Podiatry  12/11/21   Vanessa Reading, MD Consulting Physician Otolaryngology  12/11/21     Chief Complaint  Patient presents with   Fever    Onset 10/15; joint paint started with fever and gi upset     Subjective: Vanessa Contreras is a 27 y.o. Pt presents for an OV with complaints of fever Tmax 102-104 F of 8-9 days duration.  High fever first 2 days, but without antipyretic returns. Associated symptoms include right hip pain and "GI upset."  GI upset/nausea started 2 days ago w/ advil frequent use.  Treated approximately 2 weeks ago for presumed yeast infection.  No testing completed.  Experienced vaginal itching and vaginal discharge, which has been resolved.  Recent Samson Frederic use end of September per EMR. Denies recent unprotected sex.  Pt has tried OTC Tylenol/Advil to ease their symptoms.  Was in [place] 3-4 weeks ago in the woods.  She denies rash or headache. Decreased appetite since start of fever.  BM normal.   She has had intermittent abdominal pain/discomfort over the last 1 year.  CT abdomen was completed in April for lower abdominal pain.  Patient reports when she does get sick she notices that she gets right lower abdominal pain at times with fever.   Patient's last menstrual period was 07/30/2023.      07/31/2023    8:37 AM 01/24/2023    8:10 AM 12/13/2022    8:20 AM 12/11/2021    8:27 AM 12/06/2020    8:24 AM  Depression screen PHQ 2/9  Decreased Interest 0 0 0 0 0  Down, Depressed, Hopeless 0 0 0 0 0  PHQ - 2 Score 0 0 0 0 0  Altered sleeping 2 0 0    Tired, decreased energy 2 0 0    Change in appetite 1 0 0     Feeling bad or failure about yourself  0 0 0    Trouble concentrating 0 0 0    Moving slowly or fidgety/restless 0 0 0    Suicidal thoughts 0 0 0    PHQ-9 Score 5 0 0    Difficult doing work/chores Somewhat difficult        No Known Allergies Social History   Social History Narrative   Marital status/children/pets: Single   Education/employment: grad Pharmacist, hospital:      -Wears a bicycle helmet riding a bike: Yes     -smoke alarm in the home:Yes     - wears seatbelt: Yes     - Feels safe in their relationships: Yes   Past Medical History:  Diagnosis Date   Allergy    Asthma    exercise induced   Epidermal cyst of neck 03/25/2013   Migraines    Numbness on left side 05/11/2016   Past Surgical History:  Procedure Laterality Date   EAR CYST EXCISION Left 03/25/2013   Procedure: EXCISION OF A LEFT POSTERIOR AURICULAR CYST;  Surgeon: Wayland Denis, DO;  Location: [facility];  Service: Plastics;  Laterality: Left;   HAND SURGERY     giant cell  tumor removed lt hand   Family History  Problem Relation Age of Onset   Migraines Mother    Miscarriages / [place] Mother    Allergic rhinitis Mother    Allergic rhinitis Sister    Allergic rhinitis Brother    Depression Maternal Grandmother    Anxiety disorder Maternal Grandmother    Allergic rhinitis Sister    Allergic rhinitis Sister    Testicular cancer Father    Allergies as of 07/31/2023   No Known Allergies      Medication List        Accurate as of July 31, 2023  9:05 AM. If you have any questions, ask your nurse or doctor.          STOP taking these medications    Ella 30 MG tablet Generic drug: ulipristal acetate Stopped by: Felix Pacini   fluconazole 150 MG tablet Commonly known as: Diflucan Stopped by: Felix Pacini       TAKE these medications    albuterol 108 (90 Base) MCG/ACT inhaler Commonly known as: VENTOLIN HFA Inhale 2 puffs into the lungs every 6 (six) hours as  needed for wheezing.   amoxicillin-clavulanate 875-125 MG tablet Commonly known as: AUGMENTIN Take 1 tablet by mouth 2 (two) times daily. Started by: Felix Pacini   EPINEPHrine 0.3 mg/0.3 mL Soaj injection Commonly known as: Auvi-Q Inject 0.3 mg into the muscle as needed for anaphylaxis.   hydrOXYzine 10 MG tablet Commonly known as: ATARAX Take 1-3 tablets (10-30 mg total) by mouth daily as needed.   montelukast 10 MG tablet Commonly known as: Singulair Take 1 tablet (10 mg total) by mouth at bedtime.   norethindrone 0.35 MG tablet Commonly known as: MICRONOR   omeprazole 20 MG capsule Commonly known as: PRILOSEC Take 1 capsule (20 mg total) by mouth daily. Started by: Felix Pacini   rizatriptan 5 MG tablet Commonly known as: MAXALT Take 1 tablet (5 mg total) by mouth as needed for migraine. May repeat in 2 hours if needed        All past medical history, surgical history, allergies, family history, immunizations andmedications were updated in the EMR today and reviewed under the history and medication portions of their EMR.     ROS Negative, with the exception of above mentioned in HPI   Objective:  BP (!) 92/58   Pulse 80   Temp 98.3 F (36.8 C)   Wt 134 lb 3.2 oz (60.9 kg)   LMP 07/30/2023   SpO2 98%   BMI 21.44 kg/m  Body mass index is 21.44 kg/m. Physical Exam Vitals and nursing note reviewed.  Constitutional:      General: She is not in acute distress.    Appearance: Normal appearance. She is not ill-appearing, toxic-appearing or diaphoretic.  HENT:     Head: Normocephalic and atraumatic.     Right Ear: Tympanic membrane and ear canal normal.     Left Ear: Tympanic membrane and ear canal normal.     Nose: No congestion or rhinorrhea.     Mouth/Throat:     Mouth: Mucous membranes are moist.     Pharynx: No oropharyngeal exudate or posterior oropharyngeal erythema.  Eyes:     General: No scleral icterus.       Right eye: No discharge.         Left eye: No discharge.     Extraocular Movements: Extraocular movements intact.     Conjunctiva/sclera: Conjunctivae normal.     Pupils: Pupils  are equal, round, and reactive to light.  Cardiovascular:     Rate and Rhythm: Normal rate and regular rhythm.  Pulmonary:     Effort: Pulmonary effort is normal. No respiratory distress.     Breath sounds: Normal breath sounds. No wheezing, rhonchi or rales.  Abdominal:     General: Abdomen is flat. Bowel sounds are normal. There is no distension.     Palpations: Abdomen is soft.     Tenderness: There is abdominal tenderness in the right lower quadrant. There is no right CVA tenderness, left CVA tenderness, guarding or rebound. Positive signs include psoas sign. Negative signs include McBurney's sign and obturator sign.       Comments: The movement of laying back/flat on the bed caused her right lower quadrant/hip pain.  Musculoskeletal:     Right hip: No tenderness, bony tenderness or crepitus.     Right lower leg: No edema.     Left lower leg: No edema.  Skin:    General: Skin is warm.     Findings: No rash.  Neurological:     Mental Status: She is alert and oriented to person, place, and time. Mental status is at baseline.     Motor: No weakness.     Gait: Gait normal.  Psychiatric:        Mood and Affect: Mood normal.        Behavior: Behavior normal.        Thought Content: Thought content normal.        Judgment: Judgment normal.    No results found. No results found. Results for orders placed or performed in visit on 07/31/23 (from the past 24 hour(s))  POCT Urinalysis Dipstick (Automated)     Status: Abnormal   Collection Time: 07/31/23  8:44 AM  Result Value Ref Range   Color, UA yellow    Clarity, UA hazy    Glucose, UA Negative Negative   Bilirubin, UA neg    Ketones, UA neg    Spec Grav, UA 1.020 1.010 - 1.025   Blood, UA 3+    pH, UA 5.5 5.0 - 8.0   Protein, UA Positive (A) Negative   Urobilinogen, UA 0.2 0.2 or  1.0 E.U./dL   Nitrite, UA neg    Leukocytes, UA Negative Negative    Assessment/Plan: Jasmon L Sillman is a 27 y.o. female present for OV for  Fever, unspecified fever cause/RLQ abdominal pain Fever and malaise of unknown origin.  Will start with lab work ruling out tickborne illness, pelvic infection or UTI, and inflammatory markers. On exam she is tender in her right lower quadrant, concerning for mesenteric adenitis versus appendicitis, especially with ongoing fever.  We discussed waiting on CT scan of the abdomen for now since she recently had a CT scan in April for lower quadrant pain.   Since suspicion is leaning towards possible smoldering appendix, elected to go ahead and start Augmentin treatment with her ongoing fever. Rest, hydrate. - Urine cytology ancillary only([facility]) - POCT Urinalysis Dipstick (Automated) - Urinalysis w microscopic + reflex cultur - CBC w/Diff - Sedimentation rate - C-reactive protein - Comp Met (CMET) - B. burgdorfi antibodies - Rocky mtn spotted fvr abs pnl(IgG+IgM) If CBC and CRP are elevated, we will order a stat CT abdomen to rule out appendicitis She understands if symptoms worsen she needs to go to the emergency room immediately for stat imaging and labs.  Arthralgia/Right hip pain Possible viral or tickborne illness causing symptoms.  On exam her right hip is not tender, and she is tender in the right lower quadrant of her abdomen.  Hip pain could possibly be referred pain. - CBC w/Diff - Sedimentation rate - C-reactive protein - Comp Met (CMET) - B. burgdorfi antibodies - Rocky mtn spotted fvr abs pnl(IgG+IgM)  Epigastric discomfort: Possibly related to viral illness, however this just started about 2 days ago.  Fever increased use of ibuprofen as potential cause. Start omeprazole once daily for 2 to 4 weeks If needing to continue ibuprofen, take with food  Reviewed expectations re: course of current medical issues. Discussed  self-management of symptoms. Outlined signs and symptoms indicating need for more acute intervention. Patient verbalized understanding and all questions were answered. Patient received an After-Visit Summary.    Orders Placed This Encounter  Procedures   Urinalysis w microscopic + reflex cultur   CBC w/Diff   Sedimentation rate   C-reactive protein   Comp Met (CMET)   B. burgdorfi antibodies   Rocky mtn spotted fvr abs pnl(IgG+IgM)   POCT Urinalysis Dipstick (Automated)   Meds ordered this encounter  Medications   amoxicillin-clavulanate (AUGMENTIN) 875-125 MG tablet    Sig: Take 1 tablet by mouth 2 (two) times daily.    Dispense:  20 tablet    Refill:  0   omeprazole (PRILOSEC) 20 MG capsule    Sig: Take 1 capsule (20 mg total) by mouth daily.    Dispense:  30 capsule    Refill:  0   Referral Orders  No referral(s) requested today     Note is dictated utilizing voice recognition software. Although note has been proof read prior to signing, occasional typographical errors still can be missed. If any questions arise, please do not hesitate to call for verification.   electronically signed by:  Felix Pacini, DO  [facility] [place]

## 2023-08-01 ENCOUNTER — Telehealth: Payer: Self-pay | Admitting: Family Medicine

## 2023-08-01 ENCOUNTER — Ambulatory Visit: Payer: BC Managed Care – PPO | Admitting: Family Medicine

## 2023-08-01 NOTE — Telephone Encounter (Signed)
Please call patient: He blood cell counts are normal Liver and kidney fxs are normal.  Inflammatory markers: Mild elevation in esr above normal, but crp is still in normal range-but elevating.   The other labs are pending.   Recs: Although labs do not overtly indicate appendicitis, I do have concerns she has a smoldering appendix or mesenteric adenitis. Both treated differently.  Take abx as prescribed, and follow-up on Monday for reevaluation.   However- If symptoms worsen over the weekend >increase pain, not able to tolerate meds/food, vomiting, bowel changes >>>then she will need to GO TO ED immediately for STAT image and consult.

## 2023-08-01 NOTE — Telephone Encounter (Signed)
FYI pt unable to schedule for Monday

## 2023-08-01 NOTE — Telephone Encounter (Signed)
Patient is aware of labs results and recommendations. She could not follow up on Monday due to her schedule, but is scheduled for 08/08/23. She had no additional questions or concerns

## 2023-08-02 LAB — URINE CULTURE
MICRO NUMBER:: 15641347
SPECIMEN QUALITY:: ADEQUATE

## 2023-08-02 LAB — URINALYSIS W MICROSCOPIC + REFLEX CULTURE
Bacteria, UA: NONE SEEN /HPF
Bilirubin Urine: NEGATIVE
Glucose, UA: NEGATIVE
Hyaline Cast: NONE SEEN /LPF
Ketones, ur: NEGATIVE
Nitrites, Initial: NEGATIVE
Specific Gravity, Urine: 1.018 (ref 1.001–1.035)
pH: 5.5 (ref 5.0–8.0)

## 2023-08-02 LAB — CULTURE INDICATED

## 2023-08-04 LAB — URINE CYTOLOGY ANCILLARY ONLY
Chlamydia: NEGATIVE
Comment: NEGATIVE
Comment: NEGATIVE
Comment: NORMAL
Neisseria Gonorrhea: NEGATIVE
Trichomonas: NEGATIVE

## 2023-08-05 LAB — ROCKY MTN SPOTTED FVR ABS PNL(IGG+IGM)
RMSF IgG: NOT DETECTED
RMSF IgM: NOT DETECTED

## 2023-08-05 LAB — B. BURGDORFI ANTIBODIES: B burgdorferi Ab IgG+IgM: <0.9 {index}

## 2023-08-08 ENCOUNTER — Encounter: Payer: Self-pay | Admitting: Family Medicine

## 2023-08-08 ENCOUNTER — Ambulatory Visit: Payer: BC Managed Care – PPO | Admitting: Family Medicine

## 2023-08-08 VITALS — BP 106/64 | HR 64 | Temp 98.1°F | Ht 66.34 in | Wt 130.6 lb

## 2023-08-08 DIAGNOSIS — R1031 Right lower quadrant pain: Secondary | ICD-10-CM

## 2023-08-08 DIAGNOSIS — R509 Fever, unspecified: Secondary | ICD-10-CM

## 2023-08-08 NOTE — Progress Notes (Signed)
Vanessa Contreras , [birth year], 27 y.o., female MRN: [id number] Patient Care Team    Relationship Specialty Notifications Start End  Natalia Leatherwood, DO PCP - General Family Medicine  10/09/20   Ellamae Sia, DO Consulting Physician Allergy  12/11/21   Chilton Greathouse, MD Consulting Physician Pulmonary Disease  12/11/21   Vivi Barrack, DPM Consulting Physician Podiatry  12/11/21   Christia Reading, MD Consulting Physician Otolaryngology  12/11/21     Chief Complaint  Patient presents with   Follow-up    Pt states things have improved a lot, still taking Augmentin prescribed with 1-2 days left.     Subjective: Vanessa Contreras is a 27 y.o. Pt presents for an OV to follow up on fever of unknown origin and RLQ pain. Pt reports she has been fever free for the last 4 days. Abd pain has resolved. Tolerating PO. Normal body fxs.  Urinalysis, urine cytology and cbc, cmp, crp, lyme, rmsf> WNL   Prior note: with complaints of fever Tmax 102-104 F of 8-9 days duration.  High fever first 2 days, but without antipyretic returns. Associated symptoms include right hip pain and "GI upset."  GI upset/nausea started 2 days ago w/ advil frequent use.  Treated approximately 2 weeks ago for presumed yeast infection.  No testing completed.  Experienced vaginal itching and vaginal discharge, which has been resolved.  Recent Samson Frederic use end of September per EMR. Denies recent unprotected sex.  Pt has tried OTC Tylenol/Advil to ease their symptoms.  Was in [place] 3-4 weeks ago in the woods.  She denies rash or headache. Decreased appetite since start of fever.  BM normal.   She has had intermittent abdominal pain/discomfort over the last 1 year.  CT abdomen was completed in April for lower abdominal pain.  Patient reports when she does get sick she notices that she gets right lower abdominal pain at times with fever.   Patient's last menstrual period was 07/30/2023.      08/08/2023    8:51 AM 07/31/2023    8:37  AM 01/24/2023    8:10 AM 12/13/2022    8:20 AM 12/11/2021    8:27 AM  Depression screen PHQ 2/9  Decreased Interest 0 0 0 0 0  Down, Depressed, Hopeless 0 0 0 0 0  PHQ - 2 Score 0 0 0 0 0  Altered sleeping 0 2 0 0   Tired, decreased energy 0 2 0 0   Change in appetite 0 1 0 0   Feeling bad or failure about yourself  0 0 0 0   Trouble concentrating 0 0 0 0   Moving slowly or fidgety/restless 0 0 0 0   Suicidal thoughts 0 0 0 0   PHQ-9 Score 0 5 0 0   Difficult doing work/chores Not difficult at all Somewhat difficult       No Known Allergies Social History   Social History Narrative   Marital status/children/pets: Single   Education/employment: grad Pharmacist, hospital:      -Wears a bicycle helmet riding a bike: Yes     -smoke alarm in the home:Yes     - wears seatbelt: Yes     - Feels safe in their relationships: Yes   Past Medical History:  Diagnosis Date   Allergy    Asthma    exercise induced   Epidermal cyst of neck 03/25/2013   Migraines    Numbness on left  side 05/11/2016   Past Surgical History:  Procedure Laterality Date   EAR CYST EXCISION Left 03/25/2013   Procedure: EXCISION OF A LEFT POSTERIOR AURICULAR CYST;  Surgeon: Wayland Denis, DO;  Location: [facility];  Service: Plastics;  Laterality: Left;   HAND SURGERY     giant cell tumor removed lt hand   Family History  Problem Relation Age of Onset   Migraines Mother    Miscarriages / [place] Mother    Allergic rhinitis Mother    Allergic rhinitis Sister    Allergic rhinitis Brother    Depression Maternal Grandmother    Anxiety disorder Maternal Grandmother    Allergic rhinitis Sister    Allergic rhinitis Sister    Testicular cancer Father    Allergies as of 08/08/2023   No Known Allergies      Medication List        Accurate as of August 08, 2023  8:57 AM. If you have any questions, ask your nurse or doctor.          albuterol 108 (90 Base) MCG/ACT inhaler Commonly known  as: VENTOLIN HFA Inhale 2 puffs into the lungs every 6 (six) hours as needed for wheezing.   amoxicillin-clavulanate 875-125 MG tablet Commonly known as: AUGMENTIN Take 1 tablet by mouth 2 (two) times daily.   EPINEPHrine 0.3 mg/0.3 mL Soaj injection Commonly known as: Auvi-Q Inject 0.3 mg into the muscle as needed for anaphylaxis.   hydrOXYzine 10 MG tablet Commonly known as: ATARAX Take 1-3 tablets (10-30 mg total) by mouth daily as needed.   montelukast 10 MG tablet Commonly known as: Singulair Take 1 tablet (10 mg total) by mouth at bedtime.   norethindrone 0.35 MG tablet Commonly known as: MICRONOR   omeprazole 20 MG capsule Commonly known as: PRILOSEC Take 1 capsule (20 mg total) by mouth daily.   rizatriptan 5 MG tablet Commonly known as: MAXALT Take 1 tablet (5 mg total) by mouth as needed for migraine. May repeat in 2 hours if needed        All past medical history, surgical history, allergies, family history, immunizations andmedications were updated in the EMR today and reviewed under the history and medication portions of their EMR.     ROS Negative, with the exception of above mentioned in HPI   Objective:  BP 106/64   Pulse 64   Temp 98.1 F (36.7 C)   Ht 5' 6.34" (1.685 m)   Wt 130 lb 9.6 oz (59.2 kg)   LMP 07/30/2023   SpO2 99%   BMI 20.86 kg/m  Body mass index is 20.86 kg/m. Physical Exam Vitals and nursing note reviewed.  Constitutional:      General: She is not in acute distress.    Appearance: Normal appearance. She is not ill-appearing, toxic-appearing or diaphoretic.  HENT:     Head: Normocephalic and atraumatic.  Eyes:     General: No scleral icterus.       Right eye: No discharge.        Left eye: No discharge.     Extraocular Movements: Extraocular movements intact.     Conjunctiva/sclera: Conjunctivae normal.     Pupils: Pupils are equal, round, and reactive to light.  Cardiovascular:     Rate and Rhythm: Normal rate and  regular rhythm.  Pulmonary:     Effort: Pulmonary effort is normal. No respiratory distress.     Breath sounds: Normal breath sounds. No wheezing, rhonchi or rales.  Abdominal:  General: There is no distension.     Palpations: There is no mass.     Tenderness: There is no abdominal tenderness. There is no guarding or rebound.     Hernia: No hernia is present.  Musculoskeletal:     Right lower leg: No edema.     Left lower leg: No edema.  Skin:    General: Skin is warm.     Findings: No rash.  Neurological:     Mental Status: She is alert and oriented to person, place, and time. Mental status is at baseline.     Motor: No weakness.     Gait: Gait normal.  Psychiatric:        Mood and Affect: Mood normal.        Behavior: Behavior normal.        Thought Content: Thought content normal.        Judgment: Judgment normal.    No results found. No results found. No results found for this or any previous visit (from the past 24 hour(s)).   Assessment/Plan: Vanessa Contreras is a 27 y.o. female present for OV for  RLQ abdominal pain Urinalysis and blood work all reassuring and normal.  Pain and fever resolved with Augmentin BID. Discussed unknown etiology of her symptoms- DDX colitis, mesenteric adenitis vs smoldering appendix.  Recommended if symptoms recur, she is to be seen urgently. Pt reported understanding   Reviewed expectations re: course of current medical issues. Discussed self-management of symptoms. Outlined signs and symptoms indicating need for more acute intervention. Patient verbalized understanding and all questions were answered. Patient received an After-Visit Summary.    No orders of the defined types were placed in this encounter.  No orders of the defined types were placed in this encounter.  Referral Orders  No referral(s) requested today     Note is dictated utilizing voice recognition software. Although note has been proof read prior to signing,  occasional typographical errors still can be missed. If any questions arise, please do not hesitate to call for verification.   electronically signed by:  Felix Pacini, DO  [facility] [place]

## 2023-08-12 ENCOUNTER — Ambulatory Visit: Payer: BC Managed Care – PPO | Admitting: Psychology

## 2023-08-12 DIAGNOSIS — F411 Generalized anxiety disorder: Secondary | ICD-10-CM | POA: Diagnosis not present

## 2023-08-12 DIAGNOSIS — F3341 Major depressive disorder, recurrent, in partial remission: Secondary | ICD-10-CM | POA: Diagnosis not present

## 2023-08-12 NOTE — Progress Notes (Signed)
[facility] Counselor/Therapist Progress Note  Patient ID: Vanessa Contreras, MRN: [id number],    Date: 08/12/2023  Time Spent: 10:01am-10:49am     Treatment Type: Individual Therapy  Pt is seen for a virtual video visit via caregility. Pt consents to telehealth session and is aware of limitations of telehealth visits.  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   Reported Symptoms: pt reports worry re: family stressors.  Pt reports mixed feelings- undecided about initiating contact with dad  Mental Status Exam: Appearance:  Well Groomed     Behavior: Appropriate  Motor: Normal  Speech/Language:  Normal Rate  Affect: Appropriate  Mood: anxious   Thought process: normal  Thought content:   WNL  Sensory/Perceptual disturbances:   WNL  Orientation: oriented to person, place, time/date, and situation  Attention: Good  Concentration: Good  Memory: WNL  Fund of knowledge:  Good  Insight:   Good  Judgment:  Good  Impulse Control: Good   Risk Assessment: Danger to Self:  No Self-injurious Behavior: No Danger to Others: No Duty to Warn:no Physical Aggression / Violence:Yes  Access to Firearms a concern: No  Gang Involvement:No   Subjective: counselor assessed pt current functioning per pt report.  Processed w/pt recent stressors and emotions.  Discussed family stressors and healthy boundaries maintaining and ways of supporting. Explored internal conflict w/ engaging or not with dad.  Assisted in identifying values and wants and how to be consistent w/ in decisions. Explored self care w/ recent infection and f/u w/ PCP as needed.   Pt affect wnl.  Pt reported that PCP dx w/ appendix infection that treated w/ antibiotics and improved.  Pt aware of need to f/u as need w/ any future symptoms as may be a flare.  Pt discussed ongoing stressors and concern for mom and sister.  Pt discussed ways of not taking on.  Pt reports dad did send a  group text w/ vague apology to family and want to work on things.  Pt reports no furhter followup.  Pt discussed not wanting contact w/ hx of interactions.  Pt also aware of "should" thoughts related to being dad.  Pt recognized that not ready to make decision about or talk further w/ dad about.   Interventions: Cognitive Behavioral Therapy, Assertiveness/Communication, and Self care  Diagnosis:Generalized anxiety disorder  Recurrent major depressive disorder, in partial remission (HCC)  Plan: Pt to f/u w/ in 2 weeks for counseling.  Pt to f/u as scheduled w/ PCP.   Individualized Treatment Plan Strengths: martial arts and working out.  Her pets.  Supports: mom and good friend from grad school    Goal/Needs for Treatment:  In order of importance to patient 1) increased awareness for self 2) coping skills 3) express emotions effectively    Client Statement of Needs:  "Know the what and why things (anxiety/depression) are happening so I can get through and help cope.  No longer stuff things down and think I will be ok.  I have found that helped to talk about things recently"    Treatment Level:outpatient counseling.    Symptoms:anxiety, on edge, poor sleep, fatigue, depressed mood, loss of interest.difficulty concentrating.  Client Treatment Preferences: counseling every 2 weeks.  No medication at this time.     Healthcare consumer's goal for treatment:   Counselor, Forde Radon, [facility] will support the patient's ability to achieve the goals identified. Cognitive Behavioral Therapy, Assertive Communication/Conflict Resolution  Training, Management consultant, ACT, Humanistic and other evidenced-based practices will be used to promote progress towards healthy functioning.    Healthcare consumer will: Actively participate in therapy, working towards healthy functioning.     *Justification for Continuation/Discontinuation of Goal: R=Revised, O=Ongoing, A=Achieved, D=Discontinued   Goal 1)  Increased awareness of connection of thoughts, feelings, behaviors to assist in challenging and reframing to improve coping w/ stressors per pt reports/therapist observation. Baseline date 11/06/22: Progress towards goal 0; How Often - Daily Target Date Goal Was reviewed Status Code Progress towards goal/Likert rating  11/07/23                            Goal 2) Increase expression of feelings and awareness of how stressors impacting mood AEB pt reports/therapist observation.  Baseline date 11/06/22: Progress towards goal 0; How Often - Daily Target Date Goal Was reviewed Status Code Progress towards goal  11/07/23                            Goal 3) increased effective coping skills and self care to manage and decrease symptoms of  anxiety and depression.  Baseline date 11/06/22: Progress towards goal 0; How Often - Daily Target Date Goal Was reviewed Status Code Progress towards goal  11/07/23                            This plan has been reviewed and created by the following participants:  This plan will be reviewed at least every 12 months. Date Behavioral Health Clinician Date Guardian/Patient   11/06/22 Forde Radon, [facility]              11/06/22 Verbal Consent Provided                            Forde Radon, [facility]

## 2023-08-17 ENCOUNTER — Encounter: Payer: Self-pay | Admitting: Family Medicine

## 2023-08-19 ENCOUNTER — Encounter: Payer: Self-pay | Admitting: Family Medicine

## 2023-08-19 ENCOUNTER — Other Ambulatory Visit: Payer: BC Managed Care – PPO

## 2023-08-19 ENCOUNTER — Ambulatory Visit
Admission: RE | Admit: 2023-08-19 | Discharge: 2023-08-19 | Disposition: A | Discharge status: Home / Self Care | Payer: BC Managed Care – PPO | Source: Ambulatory Visit | Attending: Family Medicine | Admitting: Family Medicine

## 2023-08-19 ENCOUNTER — Ambulatory Visit: Payer: BC Managed Care – PPO | Admitting: Family Medicine

## 2023-08-19 ENCOUNTER — Other Ambulatory Visit: Payer: Self-pay

## 2023-08-19 VITALS — BP 108/62 | HR 94 | Temp 98.5°F | Wt 133.2 lb

## 2023-08-19 DIAGNOSIS — R1031 Right lower quadrant pain: Secondary | ICD-10-CM | POA: Diagnosis not present

## 2023-08-19 DIAGNOSIS — R509 Fever, unspecified: Secondary | ICD-10-CM | POA: Diagnosis not present

## 2023-08-19 LAB — CBC WITH DIFFERENTIAL/PLATELET
Basophils Absolute: 0.1 10*3/uL (ref 0.0–0.1)
Basophils Relative: 1 % (ref 0.0–3.0)
Eosinophils Absolute: 0.2 10*3/uL (ref 0.0–0.7)
Eosinophils Relative: 3.3 % (ref 0.0–5.0)
HCT: 40.5 % (ref 36.0–46.0)
Hemoglobin: 13.6 g/dL (ref 12.0–15.0)
Lymphocytes Relative: 23.9 % (ref 12.0–46.0)
Lymphs Abs: 1.1 10*3/uL (ref 0.7–4.0)
MCHC: 33.6 g/dL (ref 30.0–36.0)
MCV: 92.3 fL (ref 78.0–100.0)
Monocytes Absolute: 0.6 10*3/uL (ref 0.1–1.0)
Monocytes Relative: 11.6 % (ref 3.0–12.0)
Neutro Abs: 2.9 10*3/uL (ref 1.4–7.7)
Neutrophils Relative %: 60.2 % (ref 43.0–77.0)
Platelets: 323 10*3/uL (ref 150.0–400.0)
RBC: 4.39 Mil/uL (ref 3.87–5.11)
RDW: 14.1 % (ref 11.5–15.5)
WBC: 4.8 10*3/uL (ref 4.0–10.5)

## 2023-08-19 LAB — POCT INFLUENZA A/B
Influenza A, POC: NEGATIVE
Influenza B, POC: NEGATIVE

## 2023-08-19 LAB — POCT URINE PREGNANCY: Preg Test, Ur: NEGATIVE

## 2023-08-19 LAB — C-REACTIVE PROTEIN: CRP: 13.5 mg/dL (ref 0.5–20.0)

## 2023-08-19 LAB — POC COVID19 BINAXNOW: SARS Coronavirus 2 Ag: NEGATIVE

## 2023-08-19 LAB — SEDIMENTATION RATE: Sed Rate: 10 mm/h (ref 0–20)

## 2023-08-19 MED ORDER — IOPAMIDOL (ISOVUE-370) INJECTION 76%
75.0000 mL | Freq: Once | INTRAVENOUS | Status: AC | PRN
  Administered 2023-08-19: 75 mL via INTRAVENOUS

## 2023-08-19 NOTE — Progress Notes (Signed)
Vanessa Contreras , 1995/12/11, 27 y.o., female MRN: [id number] Patient Care Team    Relationship Specialty Notifications Start End  Natalia Leatherwood, DO PCP - General Family Medicine  10/09/20   Ellamae Sia, DO Consulting Physician Allergy  12/11/21   Chilton Greathouse, MD Consulting Physician Pulmonary Disease  12/11/21   Vivi Barrack, DPM Consulting Physician Podiatry  12/11/21   Christia Reading, MD Consulting Physician Otolaryngology  12/11/21     Chief Complaint  Patient presents with   recurrent abd pain    Fever started 3 days ago; abdominal pain started 2 days ago; cough; body ache; tylenol taken 2 hrs ago     Subjective: Vanessa Contreras is a 27 y.o. Pt presents for an OV for recurrent abdominal pain and fever of unknown origin.  She had the same symptoms 07/20/2023 and presented 07/31/2023 later for workup.  At that time she had significant right lower quadrant pain on exam fever Tmax 104 F.  Urinalysis, urine cytology, CBC, CMP, CRP, Lyme and RMSF were all normal.  She was treated with Augmentin, to cover possible colitis/acute appendicitis while workup was initiated.  Within 4 days of starting antibiotic patient stated symptoms had resolved and she was fever free.  This is been a reoccurring condition for patient over the last year.  NSAIDs will help with the fever and generalized bodyaches, but then she experiences nausea/vomiting/GI upset from the NSAIDs. Last 2 events seem to occur around the 21st day of her menstrual cycle.  Today she reports symptoms started with a fever 3 days ago, abdominal pain 2 days ago, body aches and now a cough that is nonproductive.  She denies shortness of breath.  She is taking Tylenol for fever.  Nausea present.  Decreased appetite. She denies diarrhea, constipation or any bowel habit change.  Denies melena.  Patient's last menstrual period was 07/30/2023.  Prior appointment: To follow up on fever of unknown origin and RLQ pain. Pt reports she has been  fever free for the last 4 days. Abd pain has resolved. Tolerating PO. Normal body fxs.  Urinalysis, urine cytology and cbc, cmp, crp, lyme, rmsf> WNL   Prior note: with complaints of fever Tmax 102-104 F of 8-9 days duration.  High fever first 2 days, but without antipyretic returns. Associated symptoms include right hip pain and "GI upset."  GI upset/nausea started 2 days ago w/ advil frequent use.  Treated approximately 2 weeks ago for presumed yeast infection.  No testing completed.  Experienced vaginal itching and vaginal discharge, which has been resolved.  Recent Samson Frederic use end of September per EMR.  Uncertain if EMR is accurate.  Patient denies recent unprotected sex.  Pt has tried OTC Tylenol/Advil to ease their symptoms.  Was in [place] 3-4 weeks ago in the woods.  She denies rash or headache. Decreased appetite since start of fever.  BM normal.   She has had intermittent abdominal pain/discomfort over the last 1 year.  CT abdomen was completed in April for lower abdominal pain.  Patient reports when she does get sick she notices that she gets right lower abdominal pain at times with fever.   Patient's last menstrual period was 07/30/2023.      08/08/2023    8:51 AM 07/31/2023    8:37 AM 01/24/2023    8:10 AM 12/13/2022    8:20 AM 12/11/2021    8:27 AM  Depression screen PHQ 2/9  Decreased Interest 0 0  0 0 0  Down, Depressed, Hopeless 0 0 0 0 0  PHQ - 2 Score 0 0 0 0 0  Altered sleeping 0 2 0 0   Tired, decreased energy 0 2 0 0   Change in appetite 0 1 0 0   Feeling bad or failure about yourself  0 0 0 0   Trouble concentrating 0 0 0 0   Moving slowly or fidgety/restless 0 0 0 0   Suicidal thoughts 0 0 0 0   PHQ-9 Score 0 5 0 0   Difficult doing work/chores Not difficult at all Somewhat difficult       No Known Allergies Social History   Social History Narrative   Marital status/children/pets: Single   Education/employment: grad Pharmacist, hospital:      -Wears a bicycle  helmet riding a bike: Yes     -smoke alarm in the home:Yes     - wears seatbelt: Yes     - Feels safe in their relationships: Yes   Past Medical History:  Diagnosis Date   Allergy    Asthma    exercise induced   Epidermal cyst of neck 03/25/2013   Migraines    Numbness on left side 05/11/2016   Past Surgical History:  Procedure Laterality Date   EAR CYST EXCISION Left 03/25/2013   Procedure: EXCISION OF A LEFT POSTERIOR AURICULAR CYST;  Surgeon: Wayland Denis, DO;  Location: [facility];  Service: Plastics;  Laterality: Left;   HAND SURGERY     giant cell tumor removed lt hand   Family History  Problem Relation Age of Onset   Migraines Mother    Miscarriages / [place] Mother    Allergic rhinitis Mother    Allergic rhinitis Sister    Allergic rhinitis Brother    Depression Maternal Grandmother    Anxiety disorder Maternal Grandmother    Allergic rhinitis Sister    Allergic rhinitis Sister    Testicular cancer Father    Allergies as of 08/19/2023   No Known Allergies      Medication List        Accurate as of August 19, 2023  4:08 PM. If you have any questions, ask your nurse or doctor.          STOP taking these medications    amoxicillin-clavulanate 875-125 MG tablet Commonly known as: AUGMENTIN Stopped by: Felix Pacini   EPINEPHrine 0.3 mg/0.3 mL Soaj injection Commonly known as: Auvi-Q Stopped by: Felix Pacini       TAKE these medications    albuterol 108 (90 Base) MCG/ACT inhaler Commonly known as: VENTOLIN HFA Inhale 2 puffs into the lungs every 6 (six) hours as needed for wheezing.   hydrOXYzine 10 MG tablet Commonly known as: ATARAX Take 1-3 tablets (10-30 mg total) by mouth daily as needed.   montelukast 10 MG tablet Commonly known as: Singulair Take 1 tablet (10 mg total) by mouth at bedtime.   norethindrone 0.35 MG tablet Commonly known as: MICRONOR   omeprazole 20 MG capsule Commonly known as: PRILOSEC Take 1  capsule (20 mg total) by mouth daily.   rizatriptan 5 MG tablet Commonly known as: MAXALT Take 1 tablet (5 mg total) by mouth as needed for migraine. May repeat in 2 hours if needed        All past medical history, surgical history, allergies, family history, immunizations andmedications were updated in the EMR today and reviewed under the history and medication portions of  their EMR.     ROS Negative, with the exception of above mentioned in HPI   Objective:  BP 108/62   Pulse 94   Temp 98.5 F (36.9 C)   Wt 133 lb 3.2 oz (60.4 kg)   LMP 07/30/2023   SpO2 97%   BMI 21.28 kg/m  Body mass index is 21.28 kg/m. Physical Exam Vitals and nursing note reviewed.  Constitutional:      General: She is not in acute distress.    Appearance: Normal appearance. She is not ill-appearing, toxic-appearing or diaphoretic.     Comments: Clammy  HENT:     Head: Normocephalic and atraumatic.     Right Ear: Tympanic membrane, ear canal and external ear normal.     Left Ear: Tympanic membrane, ear canal and external ear normal.     Nose: Congestion and rhinorrhea present.     Mouth/Throat:     Mouth: Mucous membranes are moist.     Pharynx: No oropharyngeal exudate or posterior oropharyngeal erythema.  Eyes:     General: No scleral icterus.       Right eye: No discharge.        Left eye: No discharge.     Extraocular Movements: Extraocular movements intact.     Conjunctiva/sclera: Conjunctivae normal.     Pupils: Pupils are equal, round, and reactive to light.  Cardiovascular:     Rate and Rhythm: Normal rate and regular rhythm.     Heart sounds: No murmur heard. Pulmonary:     Effort: Pulmonary effort is normal. No respiratory distress.     Breath sounds: Normal breath sounds. No wheezing, rhonchi or rales.     Comments: Cough present  Abdominal:     General: Abdomen is flat. Bowel sounds are normal. There is no distension.     Palpations: Abdomen is soft. There is no mass.      Tenderness: There is abdominal tenderness in the right lower quadrant. There is guarding. There is no right CVA tenderness, left CVA tenderness or rebound. Positive signs include psoas sign. Negative signs include Murphy's sign.     Hernia: No hernia is present.    Musculoskeletal:     Cervical back: Neck supple. No rigidity or tenderness.     Right lower leg: No edema.     Left lower leg: No edema.  Lymphadenopathy:     Cervical: Cervical adenopathy present.  Skin:    General: Skin is warm.     Findings: No rash.  Neurological:     Mental Status: She is alert and oriented to person, place, and time. Mental status is at baseline.     Motor: No weakness.     Gait: Gait normal.  Psychiatric:        Mood and Affect: Mood normal.        Behavior: Behavior normal.        Thought Content: Thought content normal.        Judgment: Judgment normal.    No results found. No results found. Results for orders placed or performed in visit on 08/19/23 (from the past 24 hour(s))  POC COVID-19 BinaxNow     Status: None   Collection Time: 08/19/23  9:21 AM  Result Value Ref Range   SARS Coronavirus 2 Ag Negative Negative  POCT Influenza A/B     Status: None   Collection Time: 08/19/23  9:21 AM  Result Value Ref Range   Influenza A, POC Negative Negative  Influenza B, POC Negative Negative     Assessment/Plan: IOLANDA LENNERT is a 27 y.o. female present for OV for  RLQ abdominal pain/FUO Recurrence of fever, myalgia and right lower quadrant pain.  She also has a cough this time.  Differential diagnosis in the past has been appendicitis versus mesenteric adenitis after viral syndrome. Discussed gynecological versus gastroenterology referral depending upon results today. Stat CT abdomen ordered. CBC with differential, CRP, ESR, CMP CMV, EBV Point-of-care pregnancy test negative today. Laboratory results and CT results, patient will be called and further plan discussed at that  time.  Reviewed expectations re: course of current medical issues. Discussed self-management of symptoms. Outlined signs and symptoms indicating need for more acute intervention. Patient verbalized understanding and all questions were answered. Patient received an After-Visit Summary.  50 minutes spent in patient encounter today initiating urgent workup surrounding acute abdominal pain with fever of unknown origin, that has reoccurred after treatment 3 weeks ago.  Orders Placed This Encounter  Procedures   CT ABDOMEN PELVIS W CONTRAST   CBC w/Diff   C-reactive protein   Sedimentation rate   CMV abs, IgG+IgM (cytomegalovirus)   Epstein-Barr virus VCA antibody panel   POCT urine pregnancy   POC COVID-19 BinaxNow   POCT Influenza A/B   No orders of the defined types were placed in this encounter.  Referral Orders  No referral(s) requested today     Note is dictated utilizing voice recognition software. Although note has been proof read prior to signing, occasional typographical errors still can be missed. If any questions arise, please do not hesitate to call for verification.   electronically signed by:  Felix Pacini, DO   Primary [facility] - [place]

## 2023-08-20 ENCOUNTER — Telehealth: Payer: Self-pay | Admitting: Family Medicine

## 2023-08-20 MED ORDER — LEVOFLOXACIN 750 MG PO TABS: 750.0000 mg | ORAL_TABLET | Freq: Every day | ORAL | 0 refills | Status: DC

## 2023-08-20 NOTE — Telephone Encounter (Signed)
Please call patient Blood cell count is normal, there is no obvious signs of infection despite fever. Inflammatory markers are normal.   Her CT abdomen results did not show any evidence of appendicitis or mesenteric adenitis.  There was mention of a possible right kidney infection, however with a kidney infection typically the blood cell counts are going to be elevated and a urinalysis we collected last visit would have been positive.   There is evidence of new mild pneumonia present in the left  lung.   - for now, I recommend we start Levaquin 1 tab daily for 7 days, this would cover both kidney infection and pneumonia.  She also had a fairly significant stool burden present> would encourage her to start colace OTC daily to help with bowel movements.   The EBV and CMV labs are pending.  These can take a few days to return.  Antibiotics are not typically used to treat these even if they are positive, however it would explain the fever and the symptoms.  We will call her once we have these results  F/u 1 week

## 2023-08-20 NOTE — Telephone Encounter (Signed)
Spoke with patient regarding results/recommendations.  

## 2023-08-22 ENCOUNTER — Ambulatory Visit: Payer: BC Managed Care – PPO | Admitting: Family Medicine

## 2023-08-22 LAB — EPSTEIN-BARR VIRUS VCA ANTIBODY PANEL
EBV NA IgG: <18 U/mL
EBV VCA IgG: <18 U/mL
EBV VCA IgM: <36 U/mL

## 2023-08-22 LAB — CMV ABS, IGG+IGM (CYTOMEGALOVIRUS)
CMV IgM: <30 [AU]/ml
Cytomegalovirus Ab-IgG: <0.6 U/mL

## 2023-08-22 NOTE — Addendum Note (Signed)
Addended by: Filomena Jungling on: 08/22/2023 03:32 PM   Modules accepted: Orders

## 2023-08-22 NOTE — Addendum Note (Signed)
Addended by: Filomena Jungling on: 08/22/2023 03:33 PM   Modules accepted: Orders

## 2023-08-25 ENCOUNTER — Telehealth: Payer: Self-pay | Admitting: Family Medicine

## 2023-08-25 NOTE — Telephone Encounter (Signed)
Please call patient CMV is also negative.  Please check to make sure she is feeling improved since starting the antibiotic and if she has a follow-up scheduled appointment as discussed in prior note.  It is okay to wait 10 after I get back from vacation (after Thanksgiving), as long as she is feeling improved.

## 2023-08-26 ENCOUNTER — Ambulatory Visit (INDEPENDENT_AMBULATORY_CARE_PROVIDER_SITE_OTHER): Payer: BC Managed Care – PPO | Admitting: Psychology

## 2023-08-26 DIAGNOSIS — F411 Generalized anxiety disorder: Secondary | ICD-10-CM | POA: Diagnosis not present

## 2023-08-26 DIAGNOSIS — F3341 Major depressive disorder, recurrent, in partial remission: Secondary | ICD-10-CM

## 2023-08-26 NOTE — Telephone Encounter (Signed)
 Please give the patient a call back to discuss lab results.

## 2023-08-26 NOTE — Telephone Encounter (Signed)
Left VM for pt to return my call  

## 2023-08-26 NOTE — Progress Notes (Signed)
[facility] Counselor/Therapist Progress Note  Patient ID: Vanessa Contreras, MRN: [id number],    Date: 08/26/2023  Time Spent: 10:01am-10:57am     Treatment Type: Individual Therapy  Pt is seen for a virtual video visit via caregility. Pt consents to telehealth session and is aware of limitations of telehealth visits.  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   Reported Symptoms: pt reports worry re: family stressors.  Pt changes in how she engages w/ other's stressors.   Mental Status Exam: Appearance:  Well Groomed     Behavior: Appropriate  Motor: Normal  Speech/Language:  Normal Rate  Affect: Appropriate  Mood: anxious   Thought process: normal  Thought content:   WNL  Sensory/Perceptual disturbances:   WNL  Orientation: oriented to person, place, time/date, and situation  Attention: Good  Concentration: Good  Memory: WNL  Fund of knowledge:  Good  Insight:   Good  Judgment:  Good  Impulse Control: Good   Risk Assessment: Danger to Self:  No Self-injurious Behavior: No Danger to Others: No Duty to Warn:no Physical Aggression / Violence:Yes  Access to Firearms a concern: No  Gang Involvement:No   Subjective: counselor assessed pt current functioning per pt report.  Processed w/pt recent stressors for self w/ illness/work and w/ family stressors.  Discussed notice change in how not emotionally taking on others feelings.  Assisted pt w/ processing and seeing contributing factors and benefits of boundaries and how still shows care.  Pt affect wnl.  Pt reported that she was dx w/ pneumonia last week and set back w/ school/lab work.  Pt reports feeling better and working to get caught up.  Pt discussed family stressors last week w/ sister, w/ mom and w/ dad.  Pt reflected on mom's comment of how she has changed- less emotionally escalated w/ family's stressors.  Pt increased insight for self that not a bad thing and growth  and boundaries setting.  Pt also able to identify ways she is showing care for family.      Interventions: Cognitive Behavioral Therapy, Assertiveness/Communication, and Self care  Diagnosis:Generalized anxiety disorder  Recurrent major depressive disorder, in partial remission (HCC)  Plan: Pt to f/u w/ in 2 weeks for counseling.  Pt to f/u as scheduled w/ PCP.   Individualized Treatment Plan Strengths: martial arts and working out.  Her pets.  Supports: mom and good friend from grad school    Goal/Needs for Treatment:  In order of importance to patient 1) increased awareness for self 2) coping skills 3) express emotions effectively    Client Statement of Needs:  "Know the what and why things (anxiety/depression) are happening so I can get through and help cope.  No longer stuff things down and think I will be ok.  I have found that helped to talk about things recently"    Treatment Level:outpatient counseling.    Symptoms:anxiety, on edge, poor sleep, fatigue, depressed mood, loss of interest.difficulty concentrating.  Client Treatment Preferences: counseling every 2 weeks.  No medication at this time.     Healthcare consumer's goal for treatment:   Counselor, Forde Radon, [facility] will support the patient's ability to achieve the goals identified. Cognitive Behavioral Therapy, Assertive Communication/Conflict Resolution Training, Relaxation Training, ACT, Humanistic and other evidenced-based practices will be used to promote progress towards healthy functioning.    Healthcare consumer will: Actively participate in therapy, working towards healthy functioning.     *Justification  for Continuation/Discontinuation of Goal: R=Revised, O=Ongoing, A=Achieved, D=Discontinued   Goal 1) Increased awareness of connection of thoughts, feelings, behaviors to assist in challenging and reframing to improve coping w/ stressors per pt reports/therapist observation. Baseline date 11/06/22: Progress  towards goal 0; How Often - Daily Target Date Goal Was reviewed Status Code Progress towards goal/Likert rating  11/07/23                            Goal 2) Increase expression of feelings and awareness of how stressors impacting mood AEB pt reports/therapist observation.  Baseline date 11/06/22: Progress towards goal 0; How Often - Daily Target Date Goal Was reviewed Status Code Progress towards goal  11/07/23                            Goal 3) increased effective coping skills and self care to manage and decrease symptoms of  anxiety and depression.  Baseline date 11/06/22: Progress towards goal 0; How Often - Daily Target Date Goal Was reviewed Status Code Progress towards goal  11/07/23                            This plan has been reviewed and created by the following participants:  This plan will be reviewed at least every 12 months. Date Behavioral Health Clinician Date Guardian/Patient   11/06/22 Forde Radon, [facility]              11/06/22 Verbal Consent Provided                          Forde Radon, [facility]

## 2023-08-26 NOTE — Telephone Encounter (Signed)
LVM to discuss

## 2023-08-27 ENCOUNTER — Encounter: Payer: Self-pay | Admitting: Family Medicine

## 2023-08-27 ENCOUNTER — Ambulatory Visit: Payer: BC Managed Care – PPO | Admitting: Family Medicine

## 2023-08-27 VITALS — BP 110/80 | HR 62 | Temp 97.8°F | Wt 132.4 lb

## 2023-08-27 DIAGNOSIS — J189 Pneumonia, unspecified organism: Secondary | ICD-10-CM | POA: Insufficient documentation

## 2023-08-27 DIAGNOSIS — K5900 Constipation, unspecified: Secondary | ICD-10-CM

## 2023-08-27 DIAGNOSIS — R509 Fever, unspecified: Secondary | ICD-10-CM | POA: Diagnosis not present

## 2023-08-27 DIAGNOSIS — R1031 Right lower quadrant pain: Secondary | ICD-10-CM

## 2023-08-27 HISTORY — DX: Pneumonia, unspecified organism: J18.9

## 2023-08-27 NOTE — Telephone Encounter (Signed)
Pt had appt today.

## 2023-08-27 NOTE — Patient Instructions (Addendum)

## 2023-08-27 NOTE — Progress Notes (Signed)
Vanessa Contreras , [birth year], 27 y.o., female MRN: [id number] Patient Care Team    Relationship Specialty Notifications Start End  Natalia Leatherwood, DO PCP - General Family Medicine  10/09/20   Ellamae Sia, DO Consulting Physician Allergy  12/11/21   Chilton Greathouse, MD Consulting Physician Pulmonary Disease  12/11/21   Vivi Barrack, DPM Consulting Physician Podiatry  12/11/21   Christia Reading, MD Consulting Physician Otolaryngology  12/11/21     Chief Complaint  Patient presents with   Abdominal Pain    1 wk f/u     Subjective: Vanessa Contreras is a 27 y.o. Pt presents for an OV to follow-up on fever of unknown origin, recurrent right lower quadrant pain and recent pneumonia.  CRP was mildly elevated at 1 occurrence of her right lower quadrant pain recently, otherwise all laboratory studies have been normal including CBC with differential, CMP, lipase, RMSF, Lyme titers, CMV, EBV, ESR, CRP, gonorrhea and chlamydia. CT abdomen 08/2023 without identifiable cause of abdominal pain.  There was moderate amount of stool burden present in question of possible early pneumonia and questionable changes in kidney reflecting an early pyelonephritis.  Urinalysis had been normal first occurrence of her recurrent abdominal pain 2 weeks prior and she was treated with Augmentin for reoccurrence of discomfort 3-4 weeks later. Patient has been treated with Levaquin x 7 days for most recent bout of abdominal pain, possible early pneumonia, possibly early pyelonephritis and returns today for follow-up.  Today she report no fever since starting LQ. She feels cough and chest has improved. Used albuterol inhaler.   Prior note: for recurrent abdominal pain and fever of unknown origin.  She had the same symptoms 07/20/2023 and presented 07/31/2023 later for workup.  At that time she had significant right lower quadrant pain on exam fever Tmax 104 F.  Urinalysis, urine cytology, CBC, CMP, CRP, Lyme and RMSF were all  normal.  She was treated with Augmentin, to cover possible colitis/acute appendicitis while workup was initiated.  Within 4 days of starting antibiotic patient stated symptoms had resolved and she was fever free.  This is been a reoccurring condition for patient over the last year.  NSAIDs will help with the fever and generalized bodyaches, but then she experiences nausea/vomiting/GI upset from the NSAIDs. Last 2 events seem to occur around the 21st day of her menstrual cycle. No abd pain.   Today she reports symptoms started with a fever 3 days ago, abdominal pain 2 days ago, body aches and now a cough that is nonproductive.  She denies shortness of breath.  She is taking Tylenol for fever.  Nausea present.  Decreased appetite. She denies diarrhea, constipation or any bowel habit change.  Denies melena.  Walk in - [facility]- breo,albuterol vivotif 16 day tx *8 caps -1 cap every other day* Patient's last menstrual period was 07/30/2023.  Prior appointment: To follow up on fever of unknown origin and RLQ pain. Pt reports she has been fever free for the last 4 days. Abd pain has resolved. Tolerating PO. Normal body fxs.  Urinalysis, urine cytology and cbc, cmp, crp, lyme, rmsf> WNL   Prior note: with complaints of fever Tmax 102-104 F of 8-9 days duration.  High fever first 2 days, but without antipyretic returns. Associated symptoms include right hip pain and "GI upset."  GI upset/nausea started 2 days ago w/ advil frequent use.  Treated approximately 2 weeks ago for presumed yeast infection.  No  testing completed.  Experienced vaginal itching and vaginal discharge, which has been resolved.  Recent Samson Frederic use end of September per EMR.  Uncertain if EMR is accurate.  Patient denies recent unprotected sex.  Pt has tried OTC Tylenol/Advil to ease their symptoms.  Was in [place] 3-4 weeks ago in the woods.  She denies rash or headache. Decreased appetite since start of fever.  BM normal.   She has had  intermittent abdominal pain/discomfort over the last 1 year.  CT abdomen was completed in April for lower abdominal pain.  Patient reports when she does get sick she notices that she gets right lower abdominal pain at times with fever.   Patient's last menstrual period was 07/30/2023.      08/08/2023    8:51 AM 07/31/2023    8:37 AM 01/24/2023    8:10 AM 12/13/2022    8:20 AM 12/11/2021    8:27 AM  Depression screen PHQ 2/9  Decreased Interest 0 0 0 0 0  Down, Depressed, Hopeless 0 0 0 0 0  PHQ - 2 Score 0 0 0 0 0  Altered sleeping 0 2 0 0   Tired, decreased energy 0 2 0 0   Change in appetite 0 1 0 0   Feeling bad or failure about yourself  0 0 0 0   Trouble concentrating 0 0 0 0   Moving slowly or fidgety/restless 0 0 0 0   Suicidal thoughts 0 0 0 0   PHQ-9 Score 0 5 0 0   Difficult doing work/chores Not difficult at all Somewhat difficult       No Known Allergies Social History   Social History Narrative   Marital status/children/pets: Single   Education/employment: grad Pharmacist, hospital:      -Wears a bicycle helmet riding a bike: Yes     -smoke alarm in the home:Yes     - wears seatbelt: Yes     - Feels safe in their relationships: Yes   Past Medical History:  Diagnosis Date   Allergy    Asthma    exercise induced   Epidermal cyst of neck 03/25/2013   Migraines    Numbness on left side 05/11/2016   Past Surgical History:  Procedure Laterality Date   EAR CYST EXCISION Left 03/25/2013   Procedure: EXCISION OF A LEFT POSTERIOR AURICULAR CYST;  Surgeon: Wayland Denis, DO;  Location:  S[facility];  Service: Plastics;  Laterality: Left;   HAND SURGERY     giant cell tumor removed lt hand   Family History  Problem Relation Age of Onset   Migraines Mother    Miscarriages / [place] Mother    Allergic rhinitis Mother    Allergic rhinitis Sister    Allergic rhinitis Brother    Depression Maternal Grandmother    Anxiety disorder Maternal Grandmother     Allergic rhinitis Sister    Allergic rhinitis Sister    Testicular cancer Father    Allergies as of 08/27/2023   No Known Allergies      Medication List        Accurate as of August 27, 2023  8:05 AM. If you have any questions, ask your nurse or doctor.          STOP taking these medications    Breo Ellipta 100-25 MCG/ACT Aepb Generic drug: fluticasone furoate-vilanterol Stopped by: Felix Pacini   levofloxacin 750 MG tablet Commonly known as: Levaquin Stopped by: Felix Pacini   Vivotif  DR capsule Generic drug: typhoid Stopped by: Felix Pacini       TAKE these medications    albuterol 108 (90 Base) MCG/ACT inhaler Commonly known as: VENTOLIN HFA Inhale 2 puffs into the lungs every 6 (six) hours as needed for wheezing. What changed: Another medication with the same name was removed. Continue taking this medication, and follow the directions you see here. Changed by: Felix Pacini   hydrOXYzine 10 MG tablet Commonly known as: ATARAX Take 1-3 tablets (10-30 mg total) by mouth daily as needed.   montelukast 10 MG tablet Commonly known as: Singulair Take 1 tablet (10 mg total) by mouth at bedtime.   norethindrone 0.35 MG tablet Commonly known as: MICRONOR   omeprazole 20 MG capsule Commonly known as: PRILOSEC Take 1 capsule (20 mg total) by mouth daily.   rizatriptan 5 MG tablet Commonly known as: MAXALT Take 1 tablet (5 mg total) by mouth as needed for migraine. May repeat in 2 hours if needed        All past medical history, surgical history, allergies, family history, immunizations andmedications were updated in the EMR today and reviewed under the history and medication portions of their EMR.     ROS Negative, with the exception of above mentioned in HPI   Objective:  BP 110/80   Pulse 62   Temp 97.8 F (36.6 C)   Wt 132 lb 6.4 oz (60.1 kg)   LMP 07/30/2023   SpO2 99%   BMI 21.15 kg/m  Body mass index is 21.15 kg/m. Physical  Exam Vitals and nursing note reviewed.  Constitutional:      General: She is not in acute distress.    Appearance: Normal appearance. She is not ill-appearing, toxic-appearing or diaphoretic.  HENT:     Head: Normocephalic and atraumatic.     Nose: Congestion present.  Eyes:     General: No scleral icterus.       Right eye: No discharge.        Left eye: No discharge.     Extraocular Movements: Extraocular movements intact.     Conjunctiva/sclera: Conjunctivae normal.     Pupils: Pupils are equal, round, and reactive to light.  Cardiovascular:     Rate and Rhythm: Normal rate and regular rhythm.     Heart sounds: No murmur heard. Pulmonary:     Effort: Pulmonary effort is normal. No respiratory distress.     Breath sounds: Normal breath sounds. No wheezing, rhonchi or rales.  Abdominal:     General: Abdomen is flat. Bowel sounds are normal. There is no distension.     Palpations: Abdomen is soft.     Tenderness: There is no abdominal tenderness. There is no guarding or rebound.  Musculoskeletal:     Right lower leg: No edema.     Left lower leg: No edema.  Skin:    General: Skin is warm.     Findings: No rash.  Neurological:     Mental Status: She is alert and oriented to person, place, and time. Mental status is at baseline.     Motor: No weakness.     Gait: Gait normal.  Psychiatric:        Mood and Affect: Mood normal.        Behavior: Behavior normal.        Thought Content: Thought content normal.        Judgment: Judgment normal.    No results found. No results found. No results found  for this or any previous visit (from the past 24 hour(s)).    Assessment/Plan: Vanessa Contreras is a 27 y.o. female present for OV for  RLQ abdominal pain/FUO Recurrence of fever, myalgia and right lower quadrant pain.    Differential diagnosis in the past has been appendicitis versus mesenteric adenitis after viral syndrome. Laboratory workup essentially normal, 1 mildly high  normal range CRP only finding.CBC with differential, CMP, lipase, RMSF, Lyme titers, CMV, EBV, ESR, CRP, gonorrhea and chlamydia. CT abdomen with help identifiable abdominal cause of discomfort.  She did have moderate stool burden. Point-of-care pregnancy test negative today. Recommend starting probiotic Discussed gynecological versus gastroenterology referral.   - If abdominal pain reoccurs 3 to 4 weeks after last presentation coinciding with approximately day 21 menstrual cycle, would encourage her to follow-up with gynecology for further evaluation.   -If proven not to be a gynecological source, and she is taking probiotic with refer to gastroenterology for further evaluation. Patient reports understanding and agrees with plan.  Pyelonephritis: Do not have high suspicion of pyelonephritis being the cause of her discomfort, urinalysis had been normal a few weeks prior with the abdominal pain.  Patient has been treated with Levaquin x 7 days. Will obtain urine culture today to rule out persistent urinary tract infection, as potential cause.  Pneumonia The second occurrence of the fever of unknown origin did coincide with cough, unlikely cause of first occurrence. Patient has now been treated with Augmentin course in October and a Levaquin course in November after abnormal CT. Mild cough remains.  Lungs are clear. Resolved  Reviewed expectations re: course of current medical issues. Discussed self-management of symptoms. Outlined signs and symptoms indicating need for more acute intervention. Patient verbalized understanding and all questions were answered. Patient received an After-Visit Summary.  50 minutes spent in patient encounter today initiating urgent workup surrounding acute abdominal pain with fever of unknown origin, that has reoccurred after treatment 3 weeks ago.  Orders Placed This Encounter  Procedures   Urine Culture   No orders of the defined types were placed in this  encounter.  Referral Orders  No referral(s) requested today     Note is dictated utilizing voice recognition software. Although note has been proof read prior to signing, occasional typographical errors still can be missed. If any questions arise, please do not hesitate to call for verification.   electronically signed by:  Felix Pacini, DO  [facility] [place]

## 2023-08-28 LAB — URINE CULTURE
MICRO NUMBER:: 15757960
Result:: NO GROWTH
SPECIMEN QUALITY:: ADEQUATE

## 2023-09-14 ENCOUNTER — Telehealth: Payer: BC Managed Care – PPO | Admitting: Physician Assistant

## 2023-09-14 DIAGNOSIS — H6991 Unspecified Eustachian tube disorder, right ear: Secondary | ICD-10-CM | POA: Diagnosis not present

## 2023-09-18 ENCOUNTER — Ambulatory Visit: Payer: BC Managed Care – PPO | Admitting: Psychology

## 2023-09-18 DIAGNOSIS — F3341 Major depressive disorder, recurrent, in partial remission: Secondary | ICD-10-CM

## 2023-09-18 DIAGNOSIS — F411 Generalized anxiety disorder: Secondary | ICD-10-CM | POA: Diagnosis not present

## 2023-09-18 NOTE — Progress Notes (Signed)
[facility] Counselor/Therapist Progress Note  Patient ID: Vanessa Contreras, MRN: [id number],    Date: 09/18/2023  Time Spent: 11:01am-12:00pm     Treatment Type: Individual Therapy  Pt is seen for a virtual video visit via caregility. Pt consents to telehealth session and is aware of limitations of telehealth visits.  Pt joins from her boyfriend's apartment,  reporting privacy, and counselor from her office.   Reported Symptoms: pt reports positive family interactions.  Pt reports increased anxiety w/ upcoming changes.  Mental Status Exam: Appearance:  Well Groomed     Behavior: Appropriate  Motor: Normal  Speech/Language:  Normal Rate  Affect: Appropriate  Mood: anxious   Thought process: normal  Thought content:   WNL  Sensory/Perceptual disturbances:   WNL  Orientation: oriented to person, place, time/date, and situation  Attention: Good  Concentration: Good  Memory: WNL  Fund of knowledge:  Good  Insight:   Good  Judgment:  Good  Impulse Control: Good   Risk Assessment: Danger to Self:  No Self-injurious Behavior: No Danger to Others: No Duty to Warn:no Physical Aggression / Violence:Yes  Access to Firearms a concern: No  Gang Involvement:No   Subjective: counselor assessed pt current functioning per pt report.  Processed w/pt recent positives and stressors.  Reflected positives w/ family interactions and communication w/ boyfriend.  Discussed changes w/ upcoming graduation, post doc fellowship, move and uncertainty.  Explored values and decisions consistent w/.   Pt affect wnl.  Pt reported that she has been sick w/ cold again, but getting back to extracurricular this week.  Pt reported on positives w/ family interactions and although stressors present- communicating an managing well.  Pt shared about increasing anxiety as approaching graduation w/ doctorate, applying for post doc fellowships, knowing that move is involved and that will be a change for  relationship.  Pt reported on recent talk w/ boyfriend about and how was positive and gave insight into how she engages and holds back at times due to anxiety.  Pt discussed being more intentional about not avoiding.    Interventions: Cognitive Behavioral Therapy, Assertiveness/Communication, and Self care  Diagnosis:Generalized anxiety disorder  Recurrent major depressive disorder, in partial remission (HCC)  Plan: Pt to f/u w/ in 2 weeks for counseling.  Pt to f/u as scheduled w/ PCP.   Individualized Treatment Plan Strengths: martial arts and working out.  Her pets.  Supports: mom and good friend from grad school    Goal/Needs for Treatment:  In order of importance to patient 1) increased awareness for self 2) coping skills 3) express emotions effectively    Client Statement of Needs:  "Know the what and why things (anxiety/depression) are happening so I can get through and help cope.  No longer stuff things down and think I will be ok.  I have found that helped to talk about things recently"    Treatment Level:outpatient counseling.    Symptoms:anxiety, on edge, poor sleep, fatigue, depressed mood, loss of interest.difficulty concentrating.  Client Treatment Preferences: counseling every 2 weeks.  No medication at this time.     Healthcare consumer's goal for treatment:   Counselor, Forde Radon, [facility] will support the patient's ability to achieve the goals identified. Cognitive Behavioral Therapy, Assertive Communication/Conflict Resolution Training, Relaxation Training, ACT, Humanistic and other evidenced-based practices will be used to promote progress towards healthy functioning.    Healthcare consumer will: Actively participate in therapy, working towards healthy functioning.     *Justification  for Continuation/Discontinuation of Goal: R=Revised, O=Ongoing, A=Achieved, D=Discontinued   Goal 1) Increased awareness of connection of thoughts, feelings, behaviors to assist in  challenging and reframing to improve coping w/ stressors per pt reports/therapist observation. Baseline date 11/06/22: Progress towards goal 0; How Often - Daily Target Date Goal Was reviewed Status Code Progress towards goal/Likert rating  11/07/23                            Goal 2) Increase expression of feelings and awareness of how stressors impacting mood AEB pt reports/therapist observation.  Baseline date 11/06/22: Progress towards goal 0; How Often - Daily Target Date Goal Was reviewed Status Code Progress towards goal  11/07/23                            Goal 3) increased effective coping skills and self care to manage and decrease symptoms of  anxiety and depression.  Baseline date 11/06/22: Progress towards goal 0; How Often - Daily Target Date Goal Was reviewed Status Code Progress towards goal  11/07/23                            This plan has been reviewed and created by the following participants:  This plan will be reviewed at least every 12 months. Date Behavioral Health Clinician Date Guardian/Patient   11/06/22 Forde Radon, [facility]              11/06/22 Verbal Consent Provided                            Forde Radon, [facility]

## 2023-09-24 ENCOUNTER — Other Ambulatory Visit: Payer: Self-pay | Admitting: Physician Assistant

## 2023-09-24 MED ORDER — NEOMYCIN-POLYMYXIN-HC 3.5-10000-1 OT SOLN
3.0000 [drp] | Freq: Four times a day (QID) | OTIC | 0 refills | Status: DC
Start: 2023-09-24 — End: 2023-12-15

## 2023-09-24 MED ORDER — HYDROCORTISONE-ACETIC ACID 1-2 % OT SOLN
3.0000 [drp] | Freq: Two times a day (BID) | OTIC | 0 refills | Status: DC
Start: 2023-09-24 — End: 2023-09-24

## 2023-09-24 NOTE — Progress Notes (Signed)

## 2023-09-24 NOTE — Addendum Note (Signed)
Addended by: Marcelline Mates C on: 09/24/2023 11:06 AM   Modules accepted: Orders

## 2023-09-24 NOTE — Progress Notes (Signed)
I have spent 5 minutes in review of e-visit questionnaire, review and updating patient chart, medical decision making and response to patient.   Mia Milan Cody Jacklynn Dehaas, PA-C    

## 2023-10-16 ENCOUNTER — Ambulatory Visit (INDEPENDENT_AMBULATORY_CARE_PROVIDER_SITE_OTHER): Payer: BC Managed Care – PPO | Admitting: Psychology

## 2023-10-16 DIAGNOSIS — F3341 Major depressive disorder, recurrent, in partial remission: Secondary | ICD-10-CM | POA: Diagnosis not present

## 2023-10-16 DIAGNOSIS — F411 Generalized anxiety disorder: Secondary | ICD-10-CM | POA: Diagnosis not present

## 2023-10-16 NOTE — Progress Notes (Signed)
 [facility] Counselor/Therapist Progress Note  Patient ID: Vanessa Contreras, MRN: [id number],    Date: 10/16/2023  Time Spent: 11:01am-12:00pm     Treatment Type: Individual Therapy  Pt is seen for a virtual video visit via caregility. Pt consents to telehealth session and is aware of limitations of telehealth visits.  Pt joins from her boyfriend's apartment,  reporting privacy, and counselor from her office.   Reported Symptoms: pt reports anxious about upcoming transitions and decisions.    Mental Status Exam: Appearance:  Well Groomed     Behavior: Appropriate  Motor: Normal  Speech/Language:  Normal Rate  Affect: Appropriate  Mood: anxious   Thought process: normal  Thought content:   WNL  Sensory/Perceptual disturbances:   WNL  Orientation: oriented to person, place, time/date, and situation  Attention: Good  Concentration: Good  Memory: WNL  Fund of knowledge:  Good  Insight:   Good  Judgment:  Good  Impulse Control: Good   Risk Assessment: Danger to Self:  No Self-injurious Behavior: No Danger to Others: No Duty to Warn:no Physical Aggression / Violence:Yes  Access to Firearms a concern: No  Gang Involvement:No   Subjective: counselor assessed pt current functioning per pt report.  Processed w/pt recent positives and stressors and emotions.  Explored pattern of emotional expression and increasing awareness of time to process before identifying emotions.     Pt affect wnl.  Pt received offer for post doc fellowship at [employer] and the reality of upcoming changes hitting. Pt reported that she initially wasn't excited and some irritable/grumpy.  Pt reports excited now and recognized that initial response related to reality and changes represents and feels that has to make quick decision w/ 2 week deadline.  Pt increased awareness of pattern time takes to process before identifying emotions.  Pt discussed some other stressors w/ family interactions.  Pt reports  positives of engaging with others, interests and increased insights.    Interventions: Cognitive Behavioral Therapy, Assertiveness/Communication, and Self care  Diagnosis:Generalized anxiety disorder  Recurrent major depressive disorder, in partial remission (HCC)  Plan: Pt to f/u w/ in 2 weeks for counseling.  Pt to f/u as scheduled w/ PCP.   Individualized Treatment Plan Strengths: martial arts and working out.  Her pets.  Supports: mom and good friend from grad school    Goal/Needs for Treatment:  In order of importance to patient 1) increased awareness for self 2) coping skills 3) express emotions effectively    Client Statement of Needs:  Know the what and why things (anxiety/depression) are happening so I can get through and help cope.  No longer stuff things down and think I will be ok.  I have found that helped to talk about things recently    Treatment Level:outpatient counseling.    Symptoms:anxiety, on edge, poor sleep, fatigue, depressed mood, loss of interest.difficulty concentrating.  Client Treatment Preferences: counseling every 2 weeks.  No medication at this time.     Healthcare consumer's goal for treatment:   Counselor, Damien Herald, [facility] will support the patient's ability to achieve the goals identified. Cognitive Behavioral Therapy, Assertive Communication/Conflict Resolution Training, Relaxation Training, ACT, Humanistic and other evidenced-based practices will be used to promote progress towards healthy functioning.    Healthcare consumer will: Actively participate in therapy, working towards healthy functioning.     *Justification for Continuation/Discontinuation of Goal: R=Revised, O=Ongoing, A=Achieved, D=Discontinued   Goal 1) Increased awareness of connection of thoughts, feelings, behaviors to assist in  challenging and reframing to improve coping w/ stressors per pt reports/therapist observation. Baseline date 11/06/22: Progress towards goal 0; How  Often - Daily Target Date Goal Was reviewed Status Code Progress towards goal/Likert rating  11/07/23                            Goal 2) Increase expression of feelings and awareness of how stressors impacting mood AEB pt reports/therapist observation.  Baseline date 11/06/22: Progress towards goal 0; How Often - Daily Target Date Goal Was reviewed Status Code Progress towards goal  11/07/23                            Goal 3) increased effective coping skills and self care to manage and decrease symptoms of  anxiety and depression.  Baseline date 11/06/22: Progress towards goal 0; How Often - Daily Target Date Goal Was reviewed Status Code Progress towards goal  11/07/23                            This plan has been reviewed and created by the following participants:  This plan will be reviewed at least every 12 months. Date Behavioral Health Clinician Date Guardian/Patient   11/06/22 Damien Herald, [facility]              11/06/22 Verbal Consent Provided                            HERALD DAMIEN [facility]               [place], [facility]

## 2023-10-30 ENCOUNTER — Ambulatory Visit: Payer: BC Managed Care – PPO | Admitting: Psychology

## 2023-10-30 DIAGNOSIS — F3341 Major depressive disorder, recurrent, in partial remission: Secondary | ICD-10-CM

## 2023-10-30 DIAGNOSIS — F411 Generalized anxiety disorder: Secondary | ICD-10-CM | POA: Diagnosis not present

## 2023-10-30 NOTE — Progress Notes (Signed)
[facility] Counselor/Therapist Progress Note  Patient ID: Vanessa Contreras, MRN: [id number],    Date: 10/30/2023  Time Spent: 10:01am-10:47am     Treatment Type: Individual Therapy  Pt is seen for a virtual video visit via caregility. Pt consents to telehealth session and is aware of limitations of telehealth visits.  Pt joins from her boyfriend's apartment,  reporting privacy, and counselor from her office.   Reported Symptoms: pt reports some stress w/ recent changes. Mood stable.    Mental Status Exam: Appearance:  Well Groomed     Behavior: Appropriate  Motor: Normal  Speech/Language:  Normal Rate  Affect: Appropriate  Mood: anxious   Thought process: normal  Thought content:   WNL  Sensory/Perceptual disturbances:   WNL  Orientation: oriented to person, place, time/date, and situation  Attention: Good  Concentration: Good  Memory: WNL  Fund of knowledge:  Good  Insight:   Good  Judgment:  Good  Impulse Control: Good   Risk Assessment: Danger to Self:  No Self-injurious Behavior: No Danger to Others: No Duty to Warn:no Physical Aggression / Violence:Yes  Access to Firearms a concern: No  Gang Involvement:No   Subjective: counselor assessed pt current functioning per pt report.  Processed w/pt recent positives and stressors.  Reflected positives of advocating for self and working towards problem solving w/ support.  Explored interactions w/ family and stressors present at home.    Pt affect wnl.  Pt reported she was able to ask for extension on job offer and met w/ other post doc opportunity and has decided that position at [employer] best for her.  Pt will accept this week.  Pt reports stressors of advisor recommended to push back defense and graduation.  Pt discussed how recognized timeline current wasn't realistic but wanted to still walk this May and was able to get permission to do and degree w/ be conferred in August.  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  Pt  feels good about how resolved.  Pt recognizes she is ready to move forward w/ move, job, independence.  Pt discussed next steps as continues towards goals.   Interventions: Cognitive Behavioral Therapy, Assertiveness/Communication, and Self care  Diagnosis:Generalized anxiety disorder  Recurrent major depressive disorder, in partial remission (HCC)  Plan: Pt to f/u w/ in 3 weeks for counseling.  Pt to f/u as scheduled w/ PCP.   Individualized Treatment Plan Strengths: martial arts and working out.  Her pets.  Supports: mom and good friend from grad school    Goal/Needs for Treatment:  In order of importance to patient 1) increased awareness for self 2) coping skills 3) express emotions effectively    Client Statement of Needs:  "Know the what and why things (anxiety/depression) are happening so I can get through and help cope.  No longer stuff things down and think I will be ok.  I have found that helped to talk about things recently"    Treatment Level:outpatient counseling.    Symptoms:anxiety, on edge, poor sleep, fatigue, depressed mood, loss of interest.difficulty concentrating.  Client Treatment Preferences: counseling every 2 weeks.  No medication at this time.     Healthcare consumer's goal for treatment:   Counselor, Forde Radon, [facility] will support the patient's ability to achieve the goals identified. Cognitive Behavioral Therapy, Assertive Communication/Conflict Resolution Training, Relaxation Training, ACT, Humanistic and other evidenced-based practices will be used to promote progress towards healthy functioning.    Healthcare consumer will: Actively participate in therapy,  working towards healthy functioning.     *Justification for Continuation/Discontinuation of Goal: R=Revised, O=Ongoing, A=Achieved, D=Discontinued   Goal 1) Increased awareness of connection of thoughts, feelings, behaviors to assist in challenging and reframing to improve coping w/ stressors per pt  reports/therapist observation. Baseline date 11/06/22: Progress towards goal 0; How Often - Daily Target Date Goal Was reviewed Status Code Progress towards goal/Likert rating  11/07/23                            Goal 2) Increase expression of feelings and awareness of how stressors impacting mood AEB pt reports/therapist observation.  Baseline date 11/06/22: Progress towards goal 0; How Often - Daily Target Date Goal Was reviewed Status Code Progress towards goal  11/07/23                            Goal 3) increased effective coping skills and self care to manage and decrease symptoms of  anxiety and depression.  Baseline date 11/06/22: Progress towards goal 0; How Often - Daily Target Date Goal Was reviewed Status Code Progress towards goal  11/07/23                            This plan has been reviewed and created by the following participants:  This plan will be reviewed at least every 12 months. Date Behavioral Health Clinician Date Guardian/Patient   11/06/22 Forde Radon, [facility]              11/06/22 Verbal Consent Provided                         Forde Radon, [facility]

## 2023-11-19 ENCOUNTER — Ambulatory Visit: Payer: BC Managed Care – PPO | Admitting: Psychology

## 2023-11-19 DIAGNOSIS — F3342 Major depressive disorder, recurrent, in full remission: Secondary | ICD-10-CM | POA: Diagnosis not present

## 2023-11-19 DIAGNOSIS — F411 Generalized anxiety disorder: Secondary | ICD-10-CM
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
## 2023-12-09 ENCOUNTER — Ambulatory Visit: Payer: BC Managed Care – PPO | Admitting: Psychology

## 2023-12-09 DIAGNOSIS — F411 Generalized anxiety disorder: Secondary | ICD-10-CM | POA: Diagnosis not present

## 2023-12-09 NOTE — Progress Notes (Signed)
 [facility] Counselor/Therapist Progress Note  Patient ID: Vanessa Contreras, MRN: [id number],    Date: 12/09/2023  Time Spent: 10:01am-10:55am     Treatment Type: Individual Therapy  Pt is seen for a virtual video visit via caregility. Pt consents to telehealth session and is aware of limitations of telehealth visits.  Pt joins from her boyfriend's apartment,  reporting privacy, and counselor from her office.   Reported Symptoms: pt reports anxiety/worry about family stressors Mental Status Exam: Appearance:  Well Groomed     Behavior: Appropriate  Motor: Normal  Speech/Language:  Normal Rate  Affect: Appropriate  Mood: anxious   Thought process: normal  Thought content:   WNL  Sensory/Perceptual disturbances:   WNL  Orientation: oriented to person, place, time/date, and situation  Attention: Good  Concentration: Good  Memory: WNL  Fund of knowledge:  Good  Insight:   Good  Judgment:  Good  Impulse Control: Good   Risk Assessment: Danger to Self:  No Self-injurious Behavior: No Danger to Others: No Duty to Warn:no Physical Aggression / Violence:Yes  Access to Firearms a concern: No  Gang Involvement:No   Subjective: Counselor assessed pt current functioning per pt report.  Processed w/pt recent positives and stressors.  Explored concerns for family w/ recent events and worry about upcoming stressors.  Assisted pt in focus coping for self and support she can give family and healthy boundaries as well.   Pt affect wnl.  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  Pt reports that stressors have been family stressors- worry for sisters and decisions they are making and worry about dad's reaction as mom files for divorce.  Pt discussed creating anxiety for her.  Pt recognizes need for self care and coping through anxiety.  Pt also recognizes that can't control things not in her control.  Pt discussed boundaries she may need to communicate and assertive w/  expressing concerns.     Interventions: Cognitive Behavioral Therapy, Assertiveness/Communication, and Self care  Diagnosis:Generalized anxiety disorder  Plan: Pt to f/u w/ in 3 weeks for counseling.  Pt to f/u as scheduled w/ PCP.    Individualized Treatment Plan Strengths: martial arts and working out.  Her pets.   Supports: mom and good friends from grad school.     Goal/Needs for Treatment:  In order of importance to patient 1) increased awareness for self 2) coping skills 3) express emotions effectively   Client Statement of Needs:  "I think I'm in a better place than before (improvements w/ mood and coping w/ anxiety)" "I am having difficulty w/ motivation for my hobbies, no energy on weekends"   Treatment Level:outpatient counseling.    Symptoms:anxiety, fatigue/low motivation  Client Treatment Preferences: counseling biweekly to monthly.  No medication at this time.    Healthcare consumer's goal for treatment:  Counselor, Forde Radon, [facility] will support the patient's ability to achieve the goals identified. Cognitive Behavioral Therapy, Assertive Communication/Conflict Resolution Training, Relaxation Training, ACT, Humanistic and other evidenced-based practices will be used to promote progress towards healthy functioning.   Healthcare consumer will: Actively participate in therapy, working towards healthy functioning.    *Justification for Continuation/Discontinuation of Goal: R=Revised, O=Ongoing, A=Achieved, D=Discontinued  Goal 1) Increased awareness of connection of thoughts, feelings, behaviors to assist in challenging and reframing to improve coping w/ stressors per pt reports/therapist observation. Baseline date 11/19/23: Progress towards goal 75; How Often - Daily Target Date Goal Was reviewed Status Code Progress towards goal/Likert rating  11/18/24                Goal 2) Increase expression of feelings and awareness of how stressors impacting mood AEB pt  reports/therapist observation.  Baseline date 11/19/23: Progress towards goal 75; How Often - Daily Target Date Goal Was reviewed Status Code Progress towards goal  11/18/24                Goal 3) increased effective coping skills and self care to manage and maintain and decrease symptoms of  anxiety and depression.  Baseline date 11/19/23: Progress towards goal 50; How Often - Daily Target Date Goal Was reviewed Status Code Progress towards goal  11/18/24                This plan has been reviewed and created by the following participants:  This plan will be reviewed at least every 12 months. Date Behavioral Health Clinician Date Guardian/Patient   11/19/23 Forde Radon, [facility]  11/19/23 Verbal Consent Provided                      Forde Radon [facility]

## 2023-12-15 ENCOUNTER — Encounter: Payer: Self-pay | Admitting: Family Medicine

## 2023-12-15 ENCOUNTER — Ambulatory Visit (INDEPENDENT_AMBULATORY_CARE_PROVIDER_SITE_OTHER): Payer: BC Managed Care – PPO | Admitting: Family Medicine

## 2023-12-15 ENCOUNTER — Other Ambulatory Visit (HOSPITAL_COMMUNITY)
Admission: RE | Admit: 2023-12-15 | Discharge: 2023-12-15 | Disposition: A | Discharge status: Home / Self Care | Source: Ambulatory Visit | Attending: Family Medicine | Admitting: Family Medicine

## 2023-12-15 VITALS — BP 114/70 | HR 72 | Temp 98.0°F | Ht 67.0 in | Wt 138.0 lb

## 2023-12-15 DIAGNOSIS — Z118 Encounter for screening for other infectious and parasitic diseases: Secondary | ICD-10-CM

## 2023-12-15 DIAGNOSIS — J4540 Moderate persistent asthma, uncomplicated: Secondary | ICD-10-CM | POA: Diagnosis not present

## 2023-12-15 DIAGNOSIS — Z7689 Persons encountering health services in other specified circumstances: Secondary | ICD-10-CM

## 2023-12-15 DIAGNOSIS — H60501 Unspecified acute noninfective otitis externa, right ear: Secondary | ICD-10-CM

## 2023-12-15 DIAGNOSIS — G43409 Hemiplegic migraine, not intractable, without status migrainosus: Secondary | ICD-10-CM

## 2023-12-15 DIAGNOSIS — Z0000 Encounter for general adult medical examination without abnormal findings: Secondary | ICD-10-CM

## 2023-12-15 LAB — COMPREHENSIVE METABOLIC PANEL
ALT: 17 U/L (ref 0–35)
AST: 18 U/L (ref 0–37)
Albumin: 4.3 g/dL (ref 3.5–5.2)
Alkaline Phosphatase: 58 U/L (ref 39–117)
BUN: 15 mg/dL (ref 6–23)
CO2: 25 meq/L (ref 19–32)
Calcium: 9.3 mg/dL (ref 8.4–10.5)
Chloride: 104 meq/L (ref 96–112)
Creatinine, Ser: 0.74 mg/dL (ref 0.40–1.20)
GFR: 112.37 mL/min (ref >60.00)
Glucose, Bld: 90 mg/dL (ref 70–99)
Potassium: 4 meq/L (ref 3.5–5.1)
Sodium: 136 meq/L (ref 135–145)
Total Bilirubin: 0.5 mg/dL (ref 0.2–1.2)
Total Protein: 7.1 g/dL (ref 6.0–8.3)

## 2023-12-15 LAB — CBC
HCT: 42.2 % (ref 36.0–46.0)
Hemoglobin: 14.3 g/dL (ref 12.0–15.0)
MCHC: 33.9 g/dL (ref 30.0–36.0)
MCV: 90.1 fl (ref 78.0–100.0)
Platelets: 340 10*3/uL (ref 150.0–400.0)
RBC: 4.68 Mil/uL (ref 3.87–5.11)
RDW: 13.2 % (ref 11.5–15.5)
WBC: 4.7 10*3/uL (ref 4.0–10.5)

## 2023-12-15 LAB — TSH: TSH: 2.48 u[IU]/mL (ref 0.35–5.50)

## 2023-12-15 MED ORDER — ACETIC ACID 2 % OT SOLN: 4.0000 [drp] | Freq: Four times a day (QID) | OTIC | 0 refills | Status: AC

## 2023-12-15 MED ORDER — RIZATRIPTAN BENZOATE 5 MG PO TABS: 5.0000 mg | ORAL_TABLET | ORAL | 11 refills | Status: AC | PRN

## 2023-12-15 MED ORDER — HYDROXYZINE HCL 10 MG PO TABS: 10.0000 mg | ORAL_TABLET | Freq: Every day | ORAL | 3 refills | Status: AC | PRN

## 2023-12-15 NOTE — Patient Instructions (Addendum)
 Good luck!        Great to see you today.  I have refilled the medication(s) we provide.   If labs were collected or images ordered, we will inform you of  results once we have received them and reviewed. We will contact you either by echart message, or telephone call.  Please give ample time to the testing facility, and our office to run,  receive and review results. Please do not call inquiring of results, even if you can see them in your chart. We will contact you as soon as we are able. If it has been over 1 week since the test was completed, and you have not yet heard from [place], then please call us.    - echart message- for normal results that have been seen by the patient already.   - telephone call: abnormal results or if patient has not viewed results in their echart.  If a referral to a specialist was entered for you, please call us in 2 weeks if you have not heard from the specialist office to schedule.

## 2023-12-15 NOTE — Progress Notes (Signed)
 Patient ID: Vanessa Contreras, female  DOB: [birth year], 28 y.o.   MRN: [id number] Patient Care Team    Relationship Specialty Notifications Start End  Natalia Leatherwood, DO PCP - General Family Medicine  10/09/20 04/06/24   Reason for change: Patient Moved  Ellamae Sia, DO Consulting Physician Allergy  12/11/21   Chilton Greathouse, MD Consulting Physician Pulmonary Disease  12/11/21   Vivi Barrack, DPM Consulting Physician Podiatry  12/11/21   Christia Reading, MD Consulting Physician Otolaryngology  12/11/21     Chief Complaint  Patient presents with   Annual Exam    And Chronic Condition Management.  Pt is fasting.  No pap.     Subjective: Vanessa Contreras is a 28 y.o.  Female  present for CPE and chronic condition management All past medical history, surgical history, allergies, family history, immunizations, medications and social history were updated in the electronic medical record today. All recent labs, ED visits and hospitalizations within the last year were reviewed.  Health maintenance:  Colonoscopy: no fhx. Start screen at 47 Mammogram: no fhx. Routine screen at 42 Cervical cancer screening: still has not est with gyn> referred Immunizations: tdap - UTD 2022, Influenza declined (encouraged yearly), HPV series completed.  Infectious disease screening: HIV and hepatitis C screenings completed.  Urine cytology, pt agreed to testing today Patient has a Dental home. Hospitalizations/ED visits: reviewed   Migraine: headaches are well controlled.  Patient has history of migraine headaches.  Responds to Maxalt.  She has continued the b12 and magnesium supplements.  Prior note: Pt reports she started having migraines when she was about 65 or 28 yrs old. She had been elevated by neurology at that time (Dr. Anne Hahn and ped neuro) for possible causes with normal EEG and work up at that time. She reports since onset her migraines have been associated with unilateral paraesthesia, weakness and Aura.  Symptoms will affect her face, arm and sometimes leg. Initially, her headaches started on the left side of her head (and body) and now present on either right or left side. She states she use to only have a migraine 1-2x a year. Over the last 6 months she has had an increase in her headaches to 1-2 a month. She denies any medication changes over this time. She is prescribed BCP by prior PCP. She has been on the same BCP for over 2 yrs. Her mother also has a h/o chronic  migraines. She reports she has never been tried on any medications. She usually will take an NSAID and lay down.        08/08/2023    8:51 AM 07/31/2023    8:37 AM 01/24/2023    8:10 AM 12/13/2022    8:20 AM 12/11/2021    8:27 AM  Depression screen PHQ 2/9  Decreased Interest 0 0 0 0 0  Down, Depressed, Hopeless 0 0 0 0 0  PHQ - 2 Score 0 0 0 0 0  Altered sleeping 0 2 0 0   Tired, decreased energy 0 2 0 0   Change in appetite 0 1 0 0   Feeling bad or failure about yourself  0 0 0 0   Trouble concentrating 0 0 0 0   Moving slowly or fidgety/restless 0 0 0 0   Suicidal thoughts 0 0 0 0   PHQ-9 Score 0 5 0 0   Difficult doing work/chores Not difficult at all Somewhat difficult  08/08/2023    8:51 AM 07/31/2023    8:49 AM 12/13/2022    8:20 AM  GAD 7 : Generalized Anxiety Score  Nervous, Anxious, on Edge 0 0 0  Control/stop worrying 0 0 0  Worry too much - different things 0 0 0  Trouble relaxing 0 0 0  Restless 0 0 0  Easily annoyed or irritable 0 0 0  Afraid - awful might happen 0 0 0  Total GAD 7 Score 0 0 0  Anxiety Difficulty Not difficult at all Not difficult at all      Immunization History  Administered Date(s) Administered   DTaP 08/04/1998, 10/03/1998, 11/27/1998, 09/04/1999, 05/10/2002   HIB (PRP-OMP) 08/04/1998, 10/03/1998, 11/27/1998, 09/04/1999   Hepatitis A 07/11/2005, 08/04/2006   Hepatitis B 11/27/1998, 02/26/1999, 09/04/1999   Hpv-Unspecified 10/05/2012, 12/15/2012, 04/26/2013   IPV  08/04/1998, 10/03/1998, 07/03/1999, 05/10/2002, 05/10/2003   Influenza Split 06/02/2017   Influenza, Seasonal, Injecte, Preservative Fre 05/21/2014, 06/26/2023   Influenza,inj,Quad PF,6+ Mos 05/23/2015, 08/06/2021   Influenza-Unspecified 08/07/2020   MMR 07/03/1999, 05/10/2002   MenQuadfi_Meningococcal Groups ACYW Conjugate 02/01/2015   Meningococcal Conjugate 11/30/2009   Moderna Covid-19 Vaccine Bivalent Booster 57yrs & up 04/22/2022   PFIZER(Purple Top)SARS-COV-2 Vaccination 12/11/2019, 01/01/2020, 09/22/2020   Tdap 11/30/2009, 12/06/2020   Typhoid Live 07/07/2018   Varicella 06/08/1999, 08/16/2011     Past Medical History:  Diagnosis Date   Allergy    Asthma    exercise induced   Epidermal cyst of neck 03/25/2013   Migraines    Numbness on left side 05/11/2016   Pneumonia of left lower lobe due to infectious organism 08/27/2023   No Known Allergies Past Surgical History:  Procedure Laterality Date   EAR CYST EXCISION Left 03/25/2013   Procedure: EXCISION OF A LEFT POSTERIOR AURICULAR CYST;  Surgeon: Wayland Denis, DO;  Location:  SURGERY CENTER;  Service: Plastics;  Laterality: Left;   HAND SURGERY     giant cell tumor removed lt hand   Family History  Problem Relation Age of Onset   Migraines Mother    Miscarriages / [place] Mother    Allergic rhinitis Mother    Allergic rhinitis Sister    Allergic rhinitis Brother    Depression Maternal Grandmother    Anxiety disorder Maternal Grandmother    Allergic rhinitis Sister    Allergic rhinitis Sister    Testicular cancer Father    Social History   Social History Narrative   Marital status/children/pets: Single   Education/employment: grad Pharmacist, hospital:      -Wears a bicycle helmet riding a bike: Yes     -smoke alarm in the home:Yes     - wears seatbelt: Yes     - Feels safe in their relationships: Yes    Allergies as of 12/15/2023   No Known Allergies      Medication List         Accurate as of December 15, 2023  9:00 AM. If you have any questions, ask your nurse or doctor.          STOP taking these medications    neomycin-polymyxin-hydrocortisone OTIC solution Commonly known as: CORTISPORIN Stopped by: Felix Pacini       TAKE these medications    acetic acid 2 % otic solution Place 4 drops into the right ear 4 (four) times daily. Started by: Felix Pacini   albuterol 108 (90 Base) MCG/ACT inhaler Commonly known as: VENTOLIN HFA Inhale 2 puffs into the lungs every 6 (  six) hours as needed for wheezing.   hydrOXYzine 10 MG tablet Commonly known as: ATARAX Take 1-3 tablets (10-30 mg total) by mouth daily as needed.   montelukast 10 MG tablet Commonly known as: Singulair Take 1 tablet (10 mg total) by mouth at bedtime.   norethindrone 0.35 MG tablet Commonly known as: MICRONOR   omeprazole 20 MG capsule Commonly known as: PRILOSEC Take 1 capsule (20 mg total) by mouth daily.   rizatriptan 5 MG tablet Commonly known as: MAXALT Take 1 tablet (5 mg total) by mouth as needed for migraine. May repeat in 2 hours if needed        All past medical history, surgical history, allergies, family history, immunizations andmedications were updated in the EMR today and reviewed under the history and medication portions of their EMR.     No results found for this or any previous visit (from the past 2160 hours).   ROS 14 pt review of systems performed and negative (unless mentioned in an HPI)  Objective: BP 114/70   Pulse 72   Temp 98 F (36.7 C)   Ht 5\' 7"  (1.702 m)   Wt 138 lb (62.6 kg)   LMP 11/17/2023   SpO2 100%   BMI 21.61 kg/m  Physical Exam Vitals and nursing note reviewed.  Constitutional:      General: She is not in acute distress.    Appearance: Normal appearance. She is not ill-appearing or toxic-appearing.  HENT:     Head: Normocephalic and atraumatic.     Right Ear: Tympanic membrane, ear canal and external ear normal. No  drainage, swelling or tenderness. There is no impacted cerumen. Tympanic membrane is not erythematous.     Left Ear: Tympanic membrane, ear canal and external ear normal. There is no impacted cerumen.     Ears:     Comments: Mild erythema/irritation EAC    Nose: No congestion or rhinorrhea.     Mouth/Throat:     Mouth: Mucous membranes are moist.     Pharynx: Oropharynx is clear. No oropharyngeal exudate or posterior oropharyngeal erythema.  Eyes:     General: No scleral icterus.       Right eye: No discharge.        Left eye: No discharge.     Extraocular Movements: Extraocular movements intact.     Conjunctiva/sclera: Conjunctivae normal.     Pupils: Pupils are equal, round, and reactive to light.  Cardiovascular:     Rate and Rhythm: Normal rate and regular rhythm.     Pulses: Normal pulses.     Heart sounds: Normal heart sounds. No murmur heard.    No friction rub. No gallop.  Pulmonary:     Effort: Pulmonary effort is normal. No respiratory distress.     Breath sounds: Normal breath sounds. No stridor. No wheezing, rhonchi or rales.  Chest:     Chest wall: No tenderness.  Abdominal:     General: Abdomen is flat. Bowel sounds are normal. There is no distension.     Palpations: Abdomen is soft. There is no mass.     Tenderness: There is no abdominal tenderness. There is no right CVA tenderness, left CVA tenderness, guarding or rebound.     Hernia: No hernia is present.  Musculoskeletal:        General: No swelling, tenderness or deformity. Normal range of motion.     Cervical back: Normal range of motion and neck supple. No rigidity or tenderness.  Right lower leg: No edema.     Left lower leg: No edema.  Lymphadenopathy:     Cervical: No cervical adenopathy.  Skin:    General: Skin is warm and dry.     Coloration: Skin is not jaundiced or pale.     Findings: No bruising, erythema, lesion or rash.  Neurological:     General: No focal deficit present.     Mental  Status: She is alert and oriented to person, place, and time. Mental status is at baseline.     Cranial Nerves: No cranial nerve deficit.     Sensory: No sensory deficit.     Motor: No weakness.     Coordination: Coordination normal.     Gait: Gait normal.     Deep Tendon Reflexes: Reflexes normal.  Psychiatric:        Mood and Affect: Mood normal.        Behavior: Behavior normal.        Thought Content: Thought content normal.        Judgment: Judgment normal.      No results found.  Assessment/plan: Vanessa Contreras is a 28 y.o. female present for cpe and chronic condition management Hemiplegic migraine without status migrainosus, not intractable Stable - reassuring neuro eval at onset was without worrisome findings.  Continue b12 1000 mcg Qd Continue magnesium supplement 250-450mg  qd Continue maxalt abortive therapy  Continue vistaril prn  Moderate persistent asthma without complication Stable Continue albuterol prn  Otitis externa Mild irritation remains.  Tx twice per e-visit.  Visol otic prescribed x 5-7 d   Routine general medical examination at a health care facility Patient was encouraged to exercise greater than 150 minutes a week. Patient was encouraged to choose a diet filled with fresh fruits and vegetables, and lean meats. AVS provided to patient today for education/recommendation on gender specific health and safety maintenance. Colonoscopy: no fhx. Start screen at 47 Mammogram: no fhx. Routine screen at 42 Cervical cancer screening: still has not est with gyn> referred Immunizations: tdap - UTD 2022, Influenza declined (encouraged yearly), HPV series completed.  Infectious disease screening: HIV and hepatitis C screenings completed.  Urine cytology, pt agreed to testing today    Pt moving in June 2025 to [place].  Orders Placed This Encounter  Procedures   TSH   Comp Met (CMET)   CBC   Meds ordered this encounter  Medications   hydrOXYzine  (ATARAX) 10 MG tablet    Sig: Take 1-3 tablets (10-30 mg total) by mouth daily as needed.    Dispense:  90 tablet    Refill:  3   rizatriptan (MAXALT) 5 MG tablet    Sig: Take 1 tablet (5 mg total) by mouth as needed for migraine. May repeat in 2 hours if needed    Dispense:  10 tablet    Refill:  11   acetic acid 2 % otic solution    Sig: Place 4 drops into the right ear 4 (four) times daily.    Dispense:  15 mL    Refill:  0   Referral Orders  No referral(s) requested today      Electronically signed by: Felix Pacini, DO [facility]

## 2023-12-17 LAB — URINE CYTOLOGY ANCILLARY ONLY
Chlamydia: NEGATIVE
Comment: NEGATIVE
Comment: NEGATIVE
Comment: NORMAL
Neisseria Gonorrhea: NEGATIVE
Trichomonas: NEGATIVE

## 2023-12-18 ENCOUNTER — Other Ambulatory Visit: Payer: Self-pay

## 2023-12-18 MED ORDER — ALBUTEROL SULFATE HFA 108 (90 BASE) MCG/ACT IN AERS: 2.0000 | INHALATION_SPRAY | Freq: Four times a day (QID) | RESPIRATORY_TRACT | 11 refills | Status: AC | PRN

## 2023-12-29 ENCOUNTER — Ambulatory Visit (INDEPENDENT_AMBULATORY_CARE_PROVIDER_SITE_OTHER): Admitting: Psychology

## 2023-12-29 DIAGNOSIS — F411 Generalized anxiety disorder: Secondary | ICD-10-CM

## 2023-12-29 NOTE — Progress Notes (Signed)
 [facility] Counselor/Therapist Progress Note  Patient ID: Vanessa Contreras, MRN: [id number],    Date: 12/29/2023  Time Spent: 9:01am-9:54am     Treatment Type: Individual Therapy  Pt is seen for a virtual video visit via caregility. Pt consents to telehealth session and is aware of limitations of telehealth visits.  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   Reported Symptoms: pt reports some increased feeling anxious/on edge this past week  Mental Status Exam: Appearance:  Well Groomed     Behavior: Appropriate  Motor: Normal  Speech/Language:  Normal Rate  Affect: Appropriate  Mood: anxious   Thought process: normal  Thought content:   WNL  Sensory/Perceptual disturbances:   WNL  Orientation: oriented to person, place, time/date, and situation  Attention: Good  Concentration: Good  Memory: WNL  Fund of knowledge:  Good  Insight:   Good  Judgment:  Good  Impulse Control: Good   Risk Assessment: Danger to Self:  No Self-injurious Behavior: No Danger to Others: No Duty to Warn:no Physical Aggression / Violence:Yes  Access to Firearms a concern: No  Gang Involvement:No   Subjective: Counselor assessed pt current functioning per pt report.  Processed w/pt recent increase in anxiety.  Assisted pt in recognizing contributing factors and ways of managing anxiety. Reflected assertiveness w/ ex and setting boundaries and how to further set boundaries.  Explored positives and things pt is looking forward to.  Pt affect wnl.  Pt reports recognizing that more anxious and feeling on edge this past week.  Pt reports no recent family stressors and making progress w/ dissertation.  Pt recognized that has some loss of motivation w/ writing over weekend but still on track to meet her goal of finishing writing this week.  Pt discussed recent interactions w/ ex seeking her help on events and expressing want to come to dissertation  defense. Pt abel to increased awareness of this impacting her anxiety.  Pt reports she was able to be direct and assert that her is not welcome at her defense.  Pt surprised that after this he sought her help on another event and recognizes that may need to be direct about no further interaction.  Pt reports excited for defense and that feels confident about presenting.  Pt reports getting excited about finding her apartment in [place] and having her own space.     Interventions: Cognitive Behavioral Therapy, Assertiveness/Communication, and Self care  Diagnosis:Generalized anxiety disorder  Plan: Pt to f/u w/ in 3 weeks for counseling.  Pt to f/u as scheduled w/ PCP.    Individualized Treatment Plan Strengths: martial arts and working out.  Her pets.   Supports: mom and good friends from grad school.     Goal/Needs for Treatment:  In order of importance to patient 1) increased awareness for self 2) coping skills 3) express emotions effectively   Client Statement of Needs:  "I think I'm in a better place than before (improvements w/ mood and coping w/ anxiety)" "I am having difficulty w/ motivation for my hobbies, no energy on weekends"   Treatment Level:outpatient counseling.    Symptoms:anxiety, fatigue/low motivation  Client Treatment Preferences: counseling biweekly to monthly.  No medication at this time.    Healthcare consumer's goal for treatment:  Counselor, Forde Radon, [facility] will support the patient's ability to achieve the goals identified. Cognitive Behavioral Therapy, Assertive Communication/Conflict Resolution Training, Relaxation Training, ACT, Humanistic and other evidenced-based practices  will be used to promote progress towards healthy functioning.   Healthcare consumer will: Actively participate in therapy, working towards healthy functioning.    *Justification for Continuation/Discontinuation of Goal: R=Revised, O=Ongoing, A=Achieved, D=Discontinued  Goal 1)  Increased awareness of connection of thoughts, feelings, behaviors to assist in challenging and reframing to improve coping w/ stressors per pt reports/therapist observation. Baseline date 11/19/23: Progress towards goal 75; How Often - Daily Target Date Goal Was reviewed Status Code Progress towards goal/Likert rating  11/18/24                Goal 2) Increase expression of feelings and awareness of how stressors impacting mood AEB pt reports/therapist observation.  Baseline date 11/19/23: Progress towards goal 75; How Often - Daily Target Date Goal Was reviewed Status Code Progress towards goal  11/18/24                Goal 3) increased effective coping skills and self care to manage and maintain and decrease symptoms of  anxiety and depression.  Baseline date 11/19/23: Progress towards goal 50; How Often - Daily Target Date Goal Was reviewed Status Code Progress towards goal  11/18/24                This plan has been reviewed and created by the following participants:  This plan will be reviewed at least every 12 months. Date Behavioral Health Clinician Date Guardian/Patient   11/19/23 Forde Radon, [facility]  11/19/23 Verbal Consent Provided                        Forde Radon [facility]

## 2024-01-12 ENCOUNTER — Other Ambulatory Visit: Payer: Self-pay | Admitting: Family Medicine

## 2024-01-27 ENCOUNTER — Ambulatory Visit (INDEPENDENT_AMBULATORY_CARE_PROVIDER_SITE_OTHER): Admitting: Psychology

## 2024-01-27 DIAGNOSIS — F411 Generalized anxiety disorder: Secondary | ICD-10-CM | POA: Diagnosis not present

## 2024-01-27 NOTE — Progress Notes (Signed)
 [facility] Counselor/Therapist Progress Note  Patient ID: Vanessa Contreras, MRN: [id number],    Date: 01/27/2024  Time Spent: 10:00am-10:56am     Treatment Type: Individual Therapy  Pt is seen for a virtual video visit via caregility. Pt consents to telehealth session and is aware of limitations of telehealth visits.  Pt joins from her boyfriend's apartment,  reporting privacy, and counselor from her home office.   Reported Symptoms: pt reports managing stress, pt reports increased awareness of not taking on other's emotions  Mental Status Exam: Appearance:  Well Groomed     Behavior: Appropriate  Motor: Normal  Speech/Language:  Normal Rate  Affect: Appropriate  Mood: anxious   Thought process: normal  Thought content:   WNL  Sensory/Perceptual disturbances:   WNL  Orientation: oriented to person, place, time/date, and situation  Attention: Good  Concentration: Good  Memory: WNL  Fund of knowledge:  Good  Insight:   Good  Judgment:  Good  Impulse Control: Good   Risk Assessment: Danger to Self:  No Self-injurious Behavior: No Danger to Others: No Duty to Warn:no Physical Aggression / Violence:Yes  Access to Firearms a concern: No  Gang Involvement:No   Subjective: Counselor assessed pt current functioning per pt report.  Processed w/pt recent positives and stressors.  Reflected positives and managing through stressors present and next steps w/ graduation and move.  Explored w/ pt mindfulness and self talk through not internalizing other's emotions and positive outcomes with.   Pt affect wnl.  Pt reports mood has been good overall.  Pt reports she defended last week and went well.  Pt reports not as difficult as had anticipated.  Pt reports stressor of dad showing up in lab last week and str of dad being difficult w/ mom through divorce process.  Pt discussed how she she is shifting to completing couple publications to submit, focus on finding place in [place]  and upcoming move.  Pt identified that she was able to be aware this weekend of boyfriends' emotions w/out taking on her self and trying to resolve.  Pt felt good about progress w/ this.   Interventions: Cognitive Behavioral Therapy, Assertiveness/Communication, and Self care  Diagnosis:Generalized anxiety disorder  Plan: Pt to f/u w/ in 3 weeks for counseling.  Pt to f/u as scheduled w/ PCP.    Individualized Treatment Plan Strengths: martial arts and working out.  Her pets.   Supports: mom and good friends from grad school.     Goal/Needs for Treatment:  In order of importance to patient 1) increased awareness for self 2) coping skills 3) express emotions effectively   Client Statement of Needs:  "I think I'm in a better place than before (improvements w/ mood and coping w/ anxiety)" "I am having difficulty w/ motivation for my hobbies, no energy on weekends"   Treatment Level:outpatient counseling.    Symptoms:anxiety, fatigue/low motivation  Client Treatment Preferences: counseling biweekly to monthly.  No medication at this time.    Healthcare consumer's goal for treatment:  Counselor, Clydie Darter, [facility] will support the patient's ability to achieve the goals identified. Cognitive Behavioral Therapy, Assertive Communication/Conflict Resolution Training, Relaxation Training, ACT, Humanistic and other evidenced-based practices will be used to promote progress towards healthy functioning.   Healthcare consumer will: Actively participate in therapy, working towards healthy functioning.    *Justification for Continuation/Discontinuation of Goal: R=Revised, O=Ongoing, A=Achieved, D=Discontinued  Goal 1) Increased awareness of connection of thoughts, feelings, behaviors to assist in  challenging and reframing to improve coping w/ stressors per pt reports/therapist observation. Baseline date 11/19/23: Progress towards goal 75; How Often - Daily Target Date Goal Was reviewed Status Code  Progress towards goal/Likert rating  11/18/24                Goal 2) Increase expression of feelings and awareness of how stressors impacting mood AEB pt reports/therapist observation.  Baseline date 11/19/23: Progress towards goal 75; How Often - Daily Target Date Goal Was reviewed Status Code Progress towards goal  11/18/24                Goal 3) increased effective coping skills and self care to manage and maintain and decrease symptoms of  anxiety and depression.  Baseline date 11/19/23: Progress towards goal 50; How Often - Daily Target Date Goal Was reviewed Status Code Progress towards goal  11/18/24                This plan has been reviewed and created by the following participants:  This plan will be reviewed at least every 12 months. Date Behavioral Health Clinician Date Guardian/Patient   11/19/23 Clydie Darter, [facility]  11/19/23 Verbal Consent Provided                     Clydie Darter [facility]

## 2024-02-18 ENCOUNTER — Ambulatory Visit (INDEPENDENT_AMBULATORY_CARE_PROVIDER_SITE_OTHER): Admitting: Psychology

## 2024-02-18 DIAGNOSIS — F411 Generalized anxiety disorder: Secondary | ICD-10-CM | POA: Diagnosis not present

## 2024-02-18 NOTE — Progress Notes (Signed)
 [facility] Counselor/Therapist Progress Note  Patient ID: Vanessa Contreras, MRN: [id number],    Date: 02/18/2024  Time Spent: 10:00am-10:58am     Treatment Type: Individual Therapy  Pt is seen for a virtual video visit via caregility. Pt consents to telehealth session and is aware of limitations of telehealth visits.  Pt joins from her boyfriend's apartment,  reporting privacy, and counselor from her home office.   Reported Symptoms: pt reports feeling a little overwhelmed w/ amount of things managing/planning w/ graduating and moving.   Mental Status Exam: Appearance:  Well Groomed     Behavior: Appropriate  Motor: Normal  Speech/Language:  Normal Rate  Affect: Appropriate  Mood: anxious   Thought process: normal  Thought content:   WNL  Sensory/Perceptual disturbances:   WNL  Orientation: oriented to person, place, time/date, and situation  Attention: Good  Concentration: Good  Memory: WNL  Fund of knowledge:  Good  Insight:   Good  Judgment:  Good  Impulse Control: Good   Risk Assessment: Danger to Self:  No Self-injurious Behavior: No Danger to Others: No Duty to Warn:no Physical Aggression / Violence:Yes  Access to Firearms a concern: No  Gang Involvement:No   Subjective: Counselor assessed pt current functioning per pt report.  Processed w/pt recent positives and stressors.  Validated emotions w/ amount of things currently managing/planning and reflected progress making and supports has.  Discussed graduation ceremony and emotions re: dad's presence.  Validated and normalized emotions. Explored self compassion response for self.   Pt affect wnl.  Pt reports w/ graduation last week and leaving tomorrow to [place], planning for move/housing, completing work responsibilities pt expressed feeling stressed.  Pt discussed how plans for finding housing options coming together but not as expected. Pt looking forward to getting to completed to reduce stress.  Pt  reported mixed emotions w/ dad coming to graduation and how feels disconnect from how presents publicly and how relationship.  Pt able to validate emotions and get more awareness of self on.      Interventions: Cognitive Behavioral Therapy, Assertiveness/Communication, and Self care  Diagnosis:Generalized anxiety disorder  Plan: Pt to f/u w/ in 3 weeks for counseling. W/ pt moving out of state- this will likely be last appointment together.  Pt to f/u as scheduled w/ PCP.    Individualized Treatment Plan Strengths: martial arts and working out.  Her pets.   Supports: mom and good friends from grad school.     Goal/Needs for Treatment:  In order of importance to patient 1) increased awareness for self 2) coping skills 3) express emotions effectively   Client Statement of Needs:  "I think I'm in a better place than before (improvements w/ mood and coping w/ anxiety)" "I am having difficulty w/ motivation for my hobbies, no energy on weekends"   Treatment Level:outpatient counseling.    Symptoms:anxiety, fatigue/low motivation  Client Treatment Preferences: counseling biweekly to monthly.  No medication at this time.    Healthcare consumer's goal for treatment:  Counselor, Clydie Darter, [facility] will support the patient's ability to achieve the goals identified. Cognitive Behavioral Therapy, Assertive Communication/Conflict Resolution Training, Relaxation Training, ACT, Humanistic and other evidenced-based practices will be used to promote progress towards healthy functioning.   Healthcare consumer will: Actively participate in therapy, working towards healthy functioning.    *Justification for Continuation/Discontinuation of Goal: R=Revised, O=Ongoing, A=Achieved, D=Discontinued  Goal 1) Increased awareness of connection of thoughts, feelings, behaviors to assist in challenging and reframing  to improve coping w/ stressors per pt reports/therapist observation. Baseline date 11/19/23:  Progress towards goal 75; How Often - Daily Target Date Goal Was reviewed Status Code Progress towards goal/Likert rating  11/18/24                Goal 2) Increase expression of feelings and awareness of how stressors impacting mood AEB pt reports/therapist observation.  Baseline date 11/19/23: Progress towards goal 75; How Often - Daily Target Date Goal Was reviewed Status Code Progress towards goal  11/18/24                Goal 3) increased effective coping skills and self care to manage and maintain and decrease symptoms of  anxiety and depression.  Baseline date 11/19/23: Progress towards goal 50; How Often - Daily Target Date Goal Was reviewed Status Code Progress towards goal  11/18/24                This plan has been reviewed and created by the following participants:  This plan will be reviewed at least every 12 months. Date Behavioral Health Clinician Date Guardian/Patient   11/19/23 Clydie Darter, [facility]  11/19/23 Verbal Consent Provided                    Clydie Darter LCM[facility]

## 2024-03-09 ENCOUNTER — Ambulatory Visit (INDEPENDENT_AMBULATORY_CARE_PROVIDER_SITE_OTHER): Admitting: Psychology

## 2024-03-09 DIAGNOSIS — F3342 Major depressive disorder, recurrent, in full remission: Secondary | ICD-10-CM

## 2024-03-09 DIAGNOSIS — F411 Generalized anxiety disorder: Secondary | ICD-10-CM

## 2024-03-09 NOTE — Progress Notes (Signed)
 [facility] Counselor/Therapist Progress Note  Patient ID: Vanessa Contreras, MRN: [id number],    Date: 03/09/2024  Time Spent: 10:01am-10:56am     Treatment Type: Individual Therapy  Pt is seen for a virtual video visit via caregility. Pt consents to telehealth session and is aware of limitations of telehealth visits.  Pt joins from her boyfriend's apartment,  reporting privacy, and counselor from her home office.   Reported Symptoms: pt reports less anxious w/ securing housing.  Pt excited for move and new job.    Mental Status Exam: Appearance:  Well Groomed     Behavior: Appropriate  Motor: Normal  Speech/Language:  Normal Rate  Affect: Appropriate  Mood: anxious   Thought process: normal  Thought content:   WNL  Sensory/Perceptual disturbances:   WNL  Orientation: oriented to person, place, time/date, and situation  Attention: Good  Concentration: Good  Memory: WNL  Fund of knowledge:  Good  Insight:   Good  Judgment:  Good  Impulse Control: Good   Risk Assessment: Danger to Self:  No Self-injurious Behavior: No Danger to Others: No Duty to Warn:no Physical Aggression / Violence:Yes  Access to Firearms a concern: No  Gang Involvement:No   Subjective: Counselor assessed pt current functioning per pt report.  Processed w/pt positives, stressors and emotions.  Explored upcoming transitions.  Discussed strengths and supports for planning and adjusting.  Reviewed tx progress, discussed discharge from services and connecting w/ services in new area if needed..  Pt affect wnl.  Pt reports found housing in [place] in walking distance to her job.  Pt feels less stressed w/ this, some worry about financial burden w/ extra start up costs.  Pt reports that she is feeling good about move, start w/ new job.  Pt identified boundaries wants to keep w/ new job and not overextending herself.  Pt reports on supports, friendships will maintain.  Pt discussed transition to long  distance relationship.  Pt reports that trying to wrap up things w/ her current job before moving in 2 weeks.  Pt acknowledged progress made, pt reports no current plans to continue counseling in [place], but will explore options if needed.    Interventions: Cognitive Behavioral Therapy, Assertiveness/Communication, and Self care  Diagnosis:Generalized anxiety disorder  Major depressive disorder, recurrent, in full remission (HCC)  Plan: Pt moving to [place] in 2 weeks.  Pt will discharge from counseling w/ this provider and will seek counseling in [place] if needed.  Pt to f/u as scheduled w/ PCP.    Individualized Treatment Plan Strengths: martial arts and working out.  Her pets.   Supports: mom and good friends from grad school.     Goal/Needs for Treatment:  In order of importance to patient 1) increased awareness for self 2) coping skills 3) express emotions effectively   Client Statement of Needs:  "I think I'm in a better place than before (improvements w/ mood and coping w/ anxiety)" "I am having difficulty w/ motivation for my hobbies, no energy on weekends"   Treatment Level:outpatient counseling.    Symptoms:anxiety, fatigue/low motivation  Client Treatment Preferences: counseling biweekly to monthly.  No medication at this time.    Healthcare consumer's goal for treatment:  Counselor, Clydie Darter, [facility] will support the patient's ability to achieve the goals identified. Cognitive Behavioral Therapy, Assertive Communication/Conflict Resolution Training, Relaxation Training, ACT, Humanistic and other evidenced-based practices will be used to promote progress towards healthy functioning.   Healthcare consumer will: Actively participate  in therapy, working towards healthy functioning.    *Justification for Continuation/Discontinuation of Goal: R=Revised, O=Ongoing, A=Achieved, D=Discontinued  Goal 1) Increased awareness of connection of thoughts, feelings, behaviors to assist  in challenging and reframing to improve coping w/ stressors per pt reports/therapist observation. Baseline date 11/19/23: Progress towards goal 75; How Often - Daily Target Date Goal Was reviewed Status Code Progress towards goal/Likert rating  11/18/24                Goal 2) Increase expression of feelings and awareness of how stressors impacting mood AEB pt reports/therapist observation.  Baseline date 11/19/23: Progress towards goal 75; How Often - Daily Target Date Goal Was reviewed Status Code Progress towards goal  11/18/24                Goal 3) increased effective coping skills and self care to manage and maintain and decrease symptoms of  anxiety and depression.  Baseline date 11/19/23: Progress towards goal 50; How Often - Daily Target Date Goal Was reviewed Status Code Progress towards goal  11/18/24                This plan has been reviewed and created by the following participants:  This plan will be reviewed at least every 12 months. Date Behavioral Health Clinician Date Guardian/Patient   11/19/23 Clydie Darter, [facility]  11/19/23 Verbal Consent Provided                         Clydie Darter L[facility]
